# Patient Record
Sex: Male | Born: 1975 | Race: White | Hispanic: No | Marital: Married | Smoking: Former smoker
Health system: Southern US, Community
[De-identification: ages and names within clinical notes are randomized; demographics above are authoritative.]

## PROBLEM LIST (undated history)

## (undated) DIAGNOSIS — Z85831 Personal history of malignant neoplasm of soft tissue: Secondary | ICD-10-CM

## (undated) DIAGNOSIS — N433 Hydrocele, unspecified: Secondary | ICD-10-CM

## (undated) DIAGNOSIS — C801 Malignant (primary) neoplasm, unspecified: Secondary | ICD-10-CM

## (undated) DIAGNOSIS — T783XXA Angioneurotic edema, initial encounter: Secondary | ICD-10-CM

## (undated) HISTORY — DX: Malignant (primary) neoplasm, unspecified: C80.1

## (undated) HISTORY — PX: OTHER SURGICAL HISTORY: SHX169

---

## 1998-08-28 ENCOUNTER — Encounter: Payer: Self-pay | Admitting: Internal Medicine

## 1998-08-28 ENCOUNTER — Ambulatory Visit (HOSPITAL_COMMUNITY): Admission: RE | Admit: 1998-08-28 | Discharge: 1998-08-28 | Payer: Self-pay | Admitting: Internal Medicine

## 2000-06-11 ENCOUNTER — Emergency Department (HOSPITAL_COMMUNITY): Admission: EM | Admit: 2000-06-11 | Discharge: 2000-06-11 | Payer: Self-pay | Admitting: Emergency Medicine

## 2003-10-18 ENCOUNTER — Emergency Department (HOSPITAL_COMMUNITY): Admission: EM | Admit: 2003-10-18 | Discharge: 2003-10-18 | Payer: Self-pay | Admitting: Emergency Medicine

## 2005-06-19 ENCOUNTER — Ambulatory Visit (HOSPITAL_COMMUNITY): Admission: RE | Admit: 2005-06-19 | Discharge: 2005-06-19 | Payer: Self-pay | Admitting: Surgery

## 2005-06-19 ENCOUNTER — Encounter (INDEPENDENT_AMBULATORY_CARE_PROVIDER_SITE_OTHER): Payer: Self-pay | Admitting: *Deleted

## 2005-06-19 ENCOUNTER — Ambulatory Visit (HOSPITAL_BASED_OUTPATIENT_CLINIC_OR_DEPARTMENT_OTHER): Admission: RE | Admit: 2005-06-19 | Discharge: 2005-06-19 | Payer: Self-pay | Admitting: Surgery

## 2005-06-19 HISTORY — PX: OTHER SURGICAL HISTORY: SHX169

## 2005-07-02 ENCOUNTER — Encounter: Admission: RE | Admit: 2005-07-02 | Discharge: 2005-07-02 | Payer: Self-pay | Admitting: Surgery

## 2005-11-04 ENCOUNTER — Encounter: Admission: RE | Admit: 2005-11-04 | Discharge: 2005-11-04 | Payer: Self-pay

## 2005-11-12 ENCOUNTER — Ambulatory Visit: Admission: RE | Admit: 2005-11-12 | Discharge: 2006-01-17 | Payer: Self-pay | Admitting: Radiation Oncology

## 2006-01-22 ENCOUNTER — Ambulatory Visit: Admission: RE | Admit: 2006-01-22 | Discharge: 2006-04-09 | Payer: Self-pay | Admitting: Radiation Oncology

## 2006-07-02 IMAGING — CT CT CHEST W/ CM
1 of 4 series · 14 of 31 positions shown, 18 images · IV contrast (75CC OMNI 300)
Comparison: none

CLINICAL DATA: Sarcoma

[Series 102: routine chest · axial · 0.70mm/px · z∈[-258,+33]mm · 14 of 277 slices shown, 18 images]
[im 22/277  mediastinal]
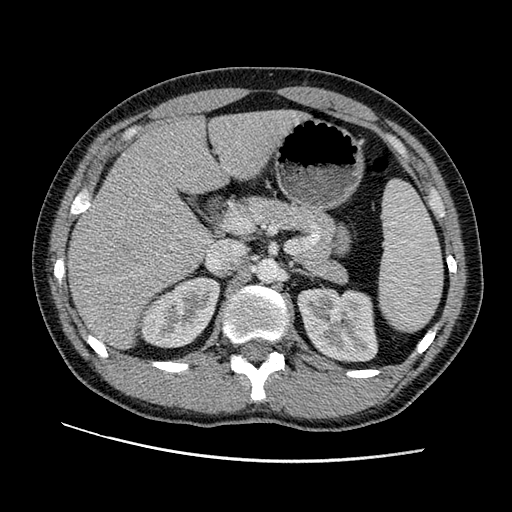
[im 22/277  lung]
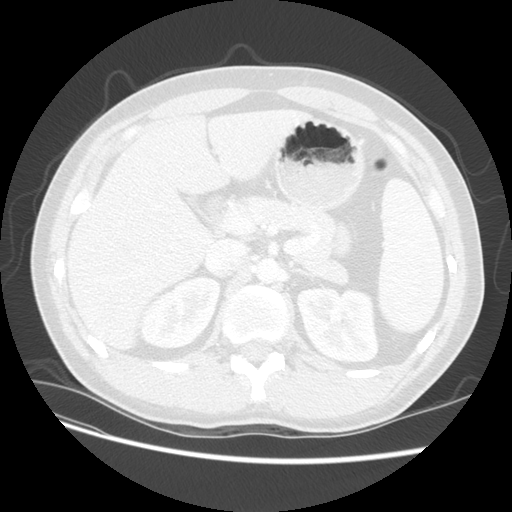
[im 43/277  lung]
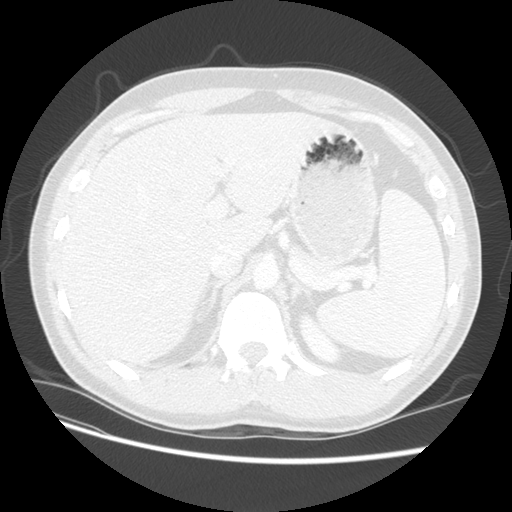
[im 64/277  lung]
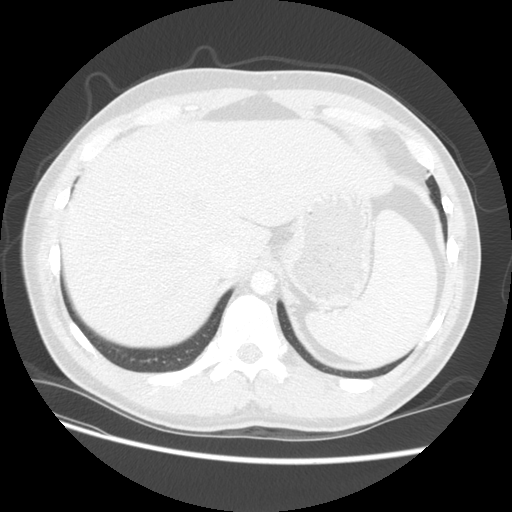
[im 85/277  lung]
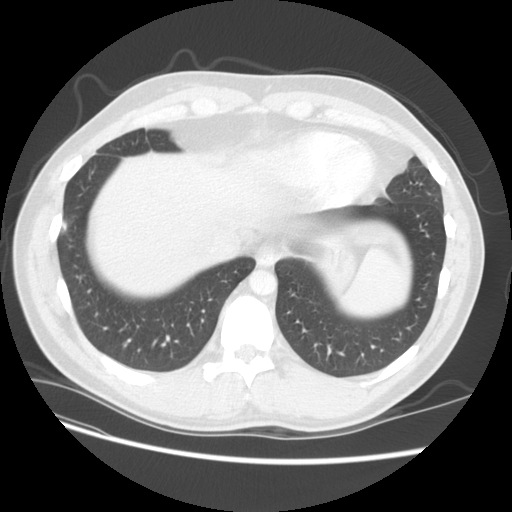
[im 107/277  mediastinal]
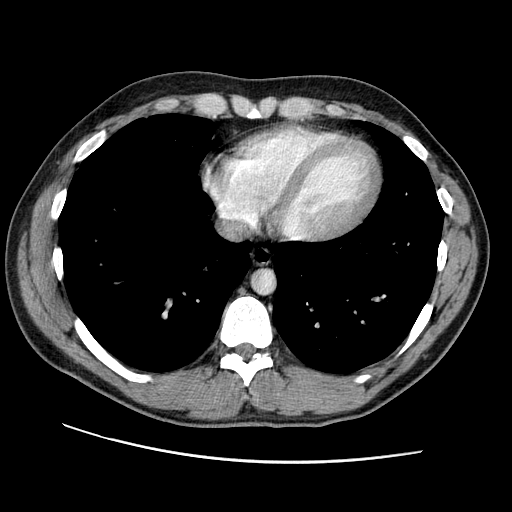
[im 107/277  lung]
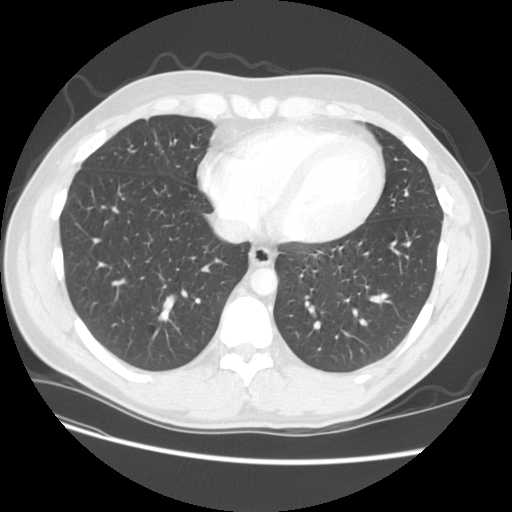
[im 128/277  lung]
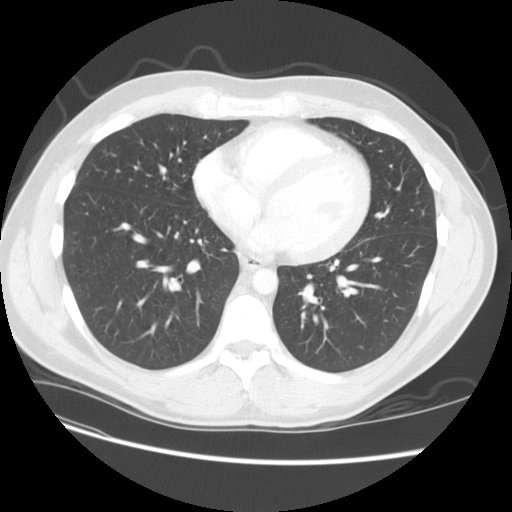
[im 133/277  lung]
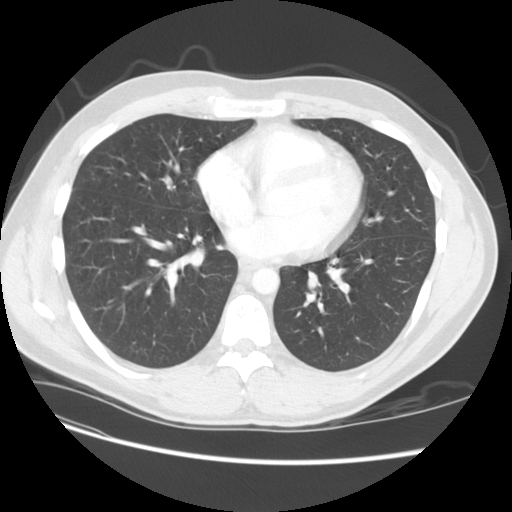
[im 139/277  lung]
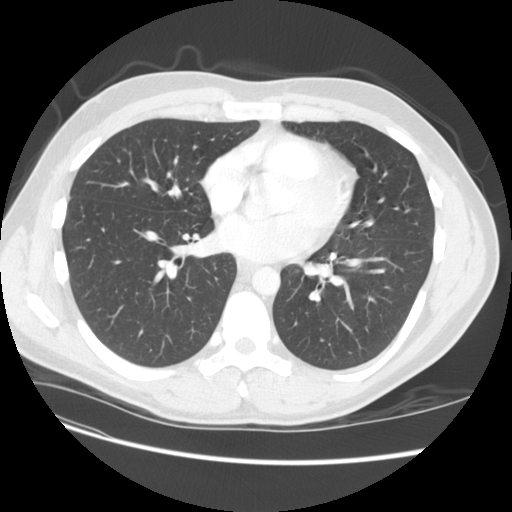
[im 149/277  mediastinal]
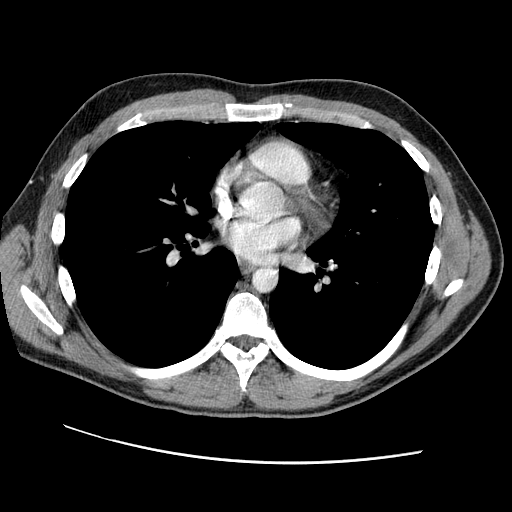
[im 149/277  lung]
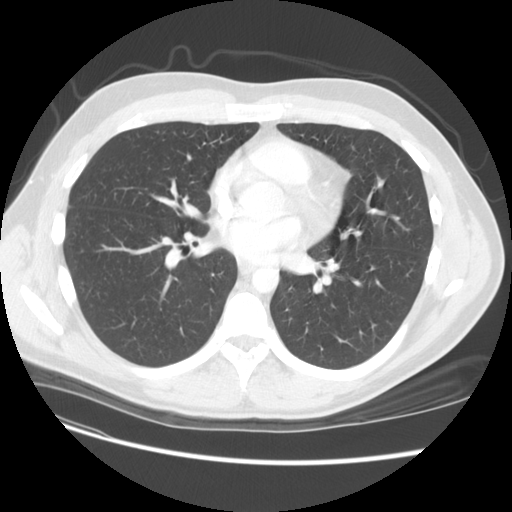
[im 170/277  lung]
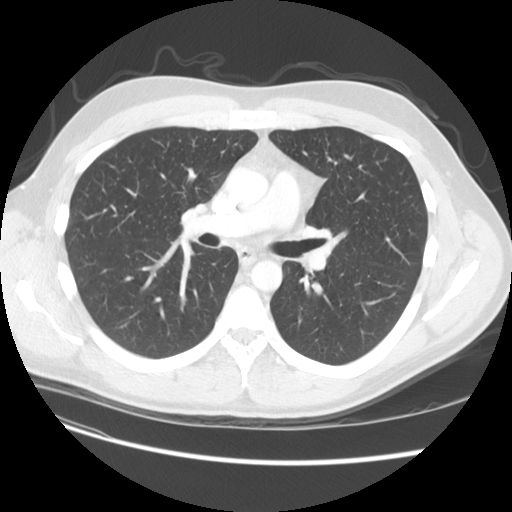
[im 192/277  lung]
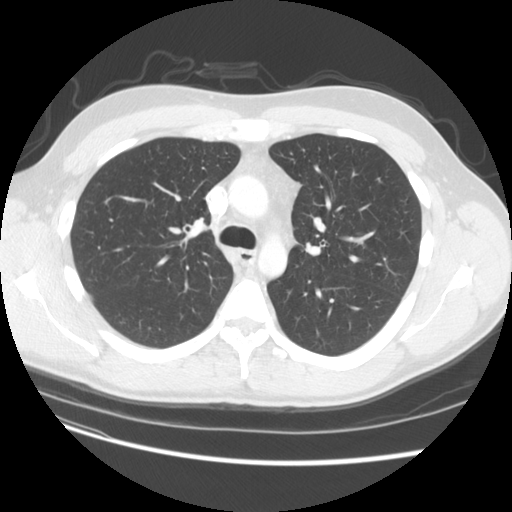
[im 213/277  lung]
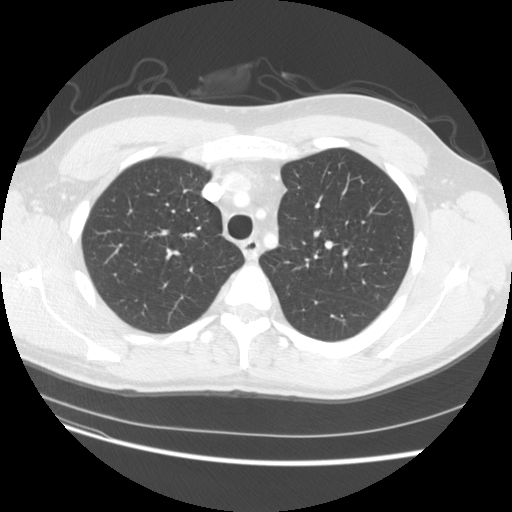
[im 234/277  mediastinal]
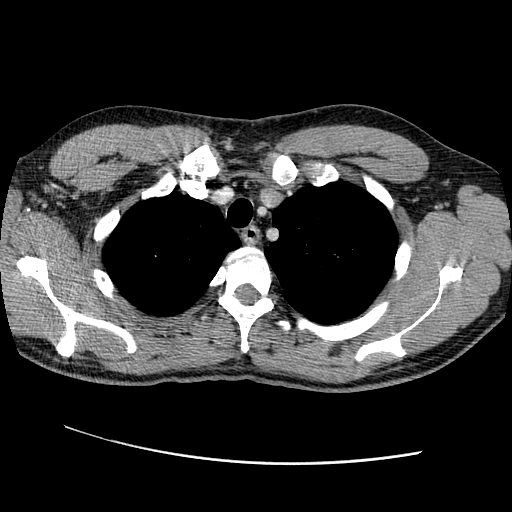
[im 234/277  lung]
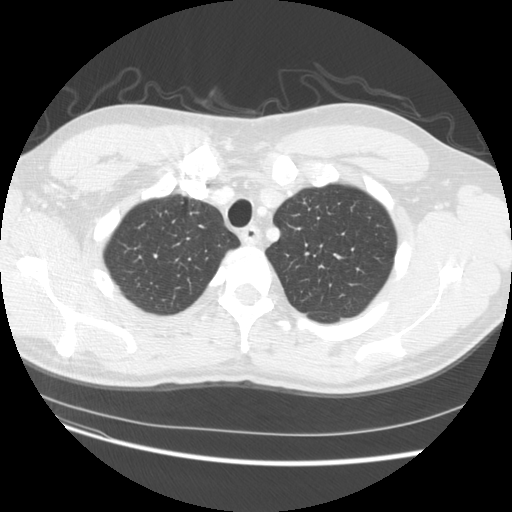
[im 255/277  lung]
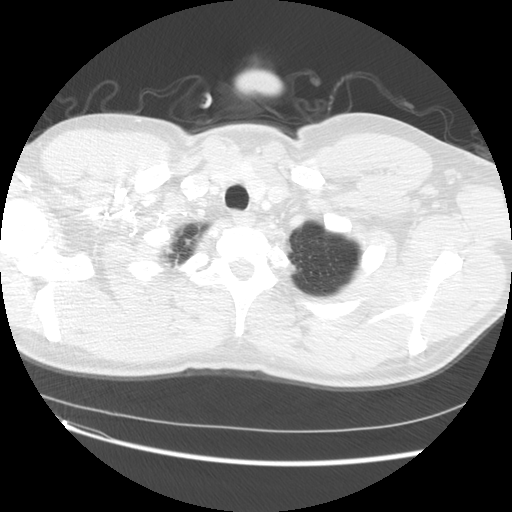

[14 of 31 positions shown; findings below may reference images not displayed]

CT chest with contrast:

Multidetector helical CT after 75 ml Zmnipaque-HOO IV.
No previous for comparison. Prominent residual thymic tissue. Negative for hilar
or mediastinal adenopathy. No pleural or pericardial effusion. Lungs clear. No
nodule. Visualized upper abdomen unremarkable.   Sagittal and coronal
reconstructions confirm the above findings.
IMPRESSION: 1. Negative for nodule, adenopathy, or other evidence of metastatic disease.

## 2007-04-22 ENCOUNTER — Ambulatory Visit: Payer: Self-pay | Admitting: Gastroenterology

## 2007-09-24 DIAGNOSIS — R011 Cardiac murmur, unspecified: Secondary | ICD-10-CM

## 2010-07-15 HISTORY — PX: OTHER SURGICAL HISTORY: SHX169

## 2010-11-27 NOTE — Letter (Signed)
April 22, 2007    Mr. Develle Sievers   RE:  CAIO, DEVERA  MRN:  [id number]  /  DOB:  [birth year]   Dear Mr. Tilden Fossa:   It is my pleasure to have treated you recently as a new patient in my  office.  I appreciate your confidence and the opportunity to participate  in your care.   Since I do have a busy inpatient endoscopy schedule and office schedule,  my office hours vary weekly.  I am, however, available for emergency  calls every day through my office.  If I cannot promptly meet an urgent  office appointment, another one of our gastroenterologists will be able  to assist you.   My well-trained staff are prepared to help you at all times.  For  emergencies after office hours, a physician from our gastroenterology  section is always available through my 24-hour answering service.   While you are under my care, I encourage discussion of your questions  and concerns, and I will be happy to return your calls as soon as I am  available.   Once again, I welcome you as a new patient and I look forward to a happy  and healthy relationship.    Sincerely,      Barbette Hair. Arlyce Dice, MD,FACG  Electronically Signed   RDK/MedQ  DD: 04/22/2007  DT: 04/22/2007  Job #: [id number]

## 2010-11-27 NOTE — Assessment & Plan Note (Signed)
[facility]                         GASTROENTEROLOGY OFFICE NOTE   NAME:[name], [name]                      MRN:          [id number]  DATE:04/22/2007                            DOB:          [birth year]    PROBLEM:  Abdominal pain.   HISTORY OF PRESENT ILLNESS:  Mr. [name] Shelton is a pleasant 35 year old white  male self-referred for evaluation of burning epigastric pain.  He has  had pain for several weeks.  It is described as a mild burning pain  accompanied by pyrosis.  It is lessened postprandially.  He is on no  gastric irritants including nonsteroidal's.  He claims to have seen some  black streaks coating his stools.  Rarely has he seen small amounts of  blood on the toilet tissue.  He has no prior GI complaints.   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   FAMILY HISTORY:  Noncontributory.   MEDICATIONS:  He is on no medications.   ALLERGIES:  He has no allergies.   SOCIAL HISTORY:  He does not smoke.  He drinks about one beer or two a  day.  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   REVIEW OF SYSTEMS:  Was reviewed and is negative.   PHYSICAL EXAMINATION:  VITAL SIGNS:  Pulse  60.  Blood pressure 118/64.  Weight 195.  HEENT:  EOMI.  PERRLA.  Sclerae are anicteric.  Conjunctivae are pink.  NECK:  Supple without thyromegaly, adenopathy or carotid bruits.  CHEST:  Clear to auscultation and percussion without adventitious  sounds.  CARDIAC:  Regular rhythm; normal S1 S2.  There are no murmurs, gallops  or rubs.  ABDOMEN:  Bowel sounds are normoactive.  Abdomen is soft, nontender and  nondistended.  There are no abdominal masses, tenderness, splenic  enlargement or hepatomegaly.  EXTREMITIES:  Full range of motion.  No cyanosis, clubbing or edema.  RECTAL:  Stool is Hemoccult negative.   IMPRESSION:  1. Nonspecific epigastric burning.   This could be due to non-ulcer      dyspepsia, possibly due to gastroesophageal reflux disease as well.  2. History of leiomyosarcoma.  3. Questionable history of gastrointestinal bleeding.   RECOMMENDATION:  1. Empiric trial of Protonix 40 mg a day.  2. Serial Hemoccult's.  3. If Mr. [name] Shelton responds to Protonix and he is Hemoccult negative, I      will not pursue his gastrointestinal work up any further.     Barbette Hair. Arlyce Dice, MD,FACG  Electronically Signed    RDK/MedQ  DD: 04/22/2007  DT: 04/22/2007  Job #: [id number]

## 2010-11-30 NOTE — Op Note (Signed)
NAMEVANCE, HOCHMUTH             ACCOUNT NO.:  [id number]   MEDICAL RECORD NO.:  [id number]          PATIENT TYPE:  AMB   LOCATION:  DSC                          FACILITY:  [facility]   PHYSICIAN:  Thornton Park. Daphine Deutscher, MD  DATE OF BIRTH:  1975/10/02   DATE OF PROCEDURE:  06/19/2005  DATE OF DISCHARGE:                                 OPERATIVE REPORT   PROCEDURE:  Right inguinal lymph node biopsy.   SURGEON:  Thornton Park. Daphine Deutscher, M.D.   ANESTHESIA:  General by LMA.   DESCRIPTION OF PROCEDURE:  Jony Ladnier was taken to room 6 at [facility] on June 19, 2005 and given general by LMA. Preoperatively, he  received Ancef and gentamicin. A small oblique incision was made down in his  inguinal crease and dissected down to a very firm, hard node was  encapsulated. I got into the capsule and I exposed this.  It was fairly  fixed to the underlying structures, and I wanted to minimize his  complications from lymphocele, etc, so I went ahead and excised the greatest  portion of this more superficial node. It was hard and felt worrisome.  I  sent it for permanent and touch preps. The remnant was cauterized with  electrocautery. It was then closed with 4-0 Vicryl in multiple layers with  Benzoin and Steri-Strips on the skin. The patient will be given Percocet to  take for pain and will be followed up in the office in a couple weeks.      Thornton Park Daphine Deutscher, MD  Electronically Signed     MBM/MEDQ  D:  06/19/2005  T:  06/19/2005  Job:  [id number]

## 2011-06-18 DIAGNOSIS — C499 Malignant neoplasm of connective and soft tissue, unspecified: Secondary | ICD-10-CM | POA: Insufficient documentation

## 2011-06-18 DIAGNOSIS — C787 Secondary malignant neoplasm of liver and intrahepatic bile duct: Secondary | ICD-10-CM | POA: Insufficient documentation

## 2011-10-14 ENCOUNTER — Telehealth: Payer: Self-pay | Admitting: Gastroenterology

## 2011-10-14 NOTE — Telephone Encounter (Signed)
Spoke with patients wife and scheduled him with Dr. Arlyce Dice on 10/16/11 at 11:00 AM.

## 2011-10-14 NOTE — Telephone Encounter (Signed)
Left a message to call back.

## 2011-10-16 ENCOUNTER — Ambulatory Visit (INDEPENDENT_AMBULATORY_CARE_PROVIDER_SITE_OTHER): Payer: Managed Care, Other (non HMO) | Admitting: Gastroenterology

## 2011-10-16 ENCOUNTER — Encounter: Payer: Self-pay | Admitting: Gastroenterology

## 2011-10-16 VITALS — BP 128/72 | HR 60 | Ht 73.0 in | Wt 194.5 lb

## 2011-10-16 DIAGNOSIS — K648 Other hemorrhoids: Secondary | ICD-10-CM | POA: Insufficient documentation

## 2011-10-16 MED ORDER — HYDROCORTISONE ACETATE 25 MG RE SUPP: 25.0000 mg | Freq: Two times a day (BID) | RECTAL | Status: DC

## 2011-10-16 NOTE — Patient Instructions (Signed)
Follow up with Dr Kaplan as needed. 

## 2011-10-16 NOTE — Progress Notes (Signed)
History of Present Illness: [name] Shelton is a pleasant 36 year old white male self-referred for evaluation of hemorrhoids. He's been complaining of rectal discomfort with some discharge of mucus and small amount of blood. He took over-the-counter suppositories with improvement. He moves his bowels regularly. There's been no change in his bowel habits. He denies abdominal pain.    Past Medical History  Diagnosis Date  . Liver cancer   . Sarcoma     right leg   Past Surgical History  Procedure Date  . Liver surgery 2012  . Leg surgery 2006    sarcoma   family history is not on file.  He is adopted. Current Outpatient Prescriptions  Medication Sig Dispense Refill  . Multiple Vitamins-Minerals (ONE-A-DAY VITACRAVES) CHEW Chew 1 tablet by mouth daily.       Allergies as of 10/16/2011  . (No Known Allergies)    reports that he quit smoking about 7 years ago. His smoking use included Cigarettes. He has never used smokeless tobacco. He reports that he drinks alcohol. He reports that he does not use illicit drugs.     Review of Systems: Pertinent positive and negative review of systems were noted in the above HPI section. All other review of systems were otherwise negative.  Vital signs were reviewed in today's medical record Physical Exam: General: Well developed , well nourished, no acute distress Head: Normocephalic and atraumatic Eyes:  sclerae anicteric, EOMI Ears: Normal auditory acuity Mouth: No deformity or lesions Neck: Supple, no masses or thyromegaly Lungs: Clear throughout to auscultation Heart: Regular rate and rhythm; no murmurs, rubs or bruits Abdomen: Soft, non tender and non distended. No masses, hepatosplenomegaly or hernias noted. Normal Bowel sounds Rectal: No external lesions Musculoskeletal: Symmetrical with no gross deformities  Skin: No lesions on visible extremities Pulses:  Normal pulses noted Extremities: No clubbing, cyanosis, edema or deformities  noted Neurological: Alert oriented x 4, grossly nonfocal Cervical Nodes:  No significant cervical adenopathy Inguinal Nodes: No significant inguinal adenopathy Psychological:  Alert and cooperative. Normal mood and affect

## 2011-10-16 NOTE — Assessment & Plan Note (Signed)
Symptoms are compatible with internal hemorrhoids. He's had a moderately good response to over-the-counter suppositories.  Recommendations #1 Anusol HC suppositories #2 to consider band ligation if symptoms recur or become refractory to medical therapy

## 2011-11-12 ENCOUNTER — Other Ambulatory Visit: Payer: Self-pay | Admitting: Gastroenterology

## 2011-11-12 DIAGNOSIS — K648 Other hemorrhoids: Secondary | ICD-10-CM

## 2011-11-12 MED ORDER — HYDROCORTISONE ACETATE 25 MG RE SUPP: 25.0000 mg | Freq: Two times a day (BID) | RECTAL | Status: DC

## 2011-11-12 NOTE — Telephone Encounter (Signed)
Medication sent to pharmacy  

## 2011-12-03 ENCOUNTER — Other Ambulatory Visit: Payer: Self-pay | Admitting: Gastroenterology

## 2013-07-22 ENCOUNTER — Ambulatory Visit (INDEPENDENT_AMBULATORY_CARE_PROVIDER_SITE_OTHER): Payer: Managed Care, Other (non HMO) | Admitting: Physician Assistant

## 2013-07-22 VITALS — BP 134/76 | HR 70 | Temp 98.3°F | Resp 16 | Ht 73.5 in | Wt 192.2 lb

## 2013-07-22 DIAGNOSIS — K649 Unspecified hemorrhoids: Secondary | ICD-10-CM

## 2013-07-22 MED ORDER — HYDROCORTISONE ACETATE 25 MG RE SUPP: 25.0000 mg | Freq: Two times a day (BID) | RECTAL | Status: DC

## 2013-07-22 NOTE — Patient Instructions (Signed)
Begin using Anusol suppositories twice daily.  Use Metamucil or Miralax (both available over the counter) to keep stools soft.  We want to avoid any straining.  Drink plenty of water and make sure you are eating high fiber foods (fruit, vegetables, whole grains) to also help keep stool soft.  If this area is worsening or not improving in the next week or so, we may need to open it   Hemorrhoids Hemorrhoids are swollen veins around the rectum or anus. There are two types of hemorrhoids:   Internal hemorrhoids. These occur in the veins just inside the rectum. They may poke through to the outside and become irritated and painful.  External hemorrhoids. These occur in the veins outside the anus and can be felt as a painful swelling or hard lump near the anus. CAUSES  Pregnancy.   Obesity.   Constipation or diarrhea.   Straining to have a bowel movement.   Sitting for long periods on the toilet.  Heavy lifting or other activity that caused you to strain.  Anal intercourse. SYMPTOMS   Pain.   Anal itching or irritation.   Rectal bleeding.   Fecal leakage.   Anal swelling.   One or more lumps around the anus.  DIAGNOSIS  Your caregiver may be able to diagnose hemorrhoids by visual examination. Other examinations or tests that may be performed include:   Examination of the rectal area with a gloved hand (digital rectal exam).   Examination of anal canal using a small tube (scope).   A blood test if you have lost a significant amount of blood.  A test to look inside the colon (sigmoidoscopy or colonoscopy). TREATMENT Most hemorrhoids can be treated at home. However, if symptoms do not seem to be getting better or if you have a lot of rectal bleeding, your caregiver may perform a procedure to help make the hemorrhoids get smaller or remove them completely. Possible treatments include:   Placing a rubber band at the base of the hemorrhoid to cut off the circulation  (rubber band ligation).   Injecting a chemical to shrink the hemorrhoid (sclerotherapy).   Using a tool to burn the hemorrhoid (infrared light therapy).   Surgically removing the hemorrhoid (hemorrhoidectomy).   Stapling the hemorrhoid to block blood flow to the tissue (hemorrhoid stapling).  HOME CARE INSTRUCTIONS   Eat foods with fiber, such as whole grains, beans, nuts, fruits, and vegetables. Ask your doctor about taking products with added fiber in them (fibersupplements).  Increase fluid intake. Drink enough water and fluids to keep your urine clear or pale yellow.   Exercise regularly.   Go to the bathroom when you have the urge to have a bowel movement. Do not wait.   Avoid straining to have bowel movements.   Keep the anal area dry and clean. Use wet toilet paper or moist towelettes after a bowel movement.   Medicated creams and suppositories may be used or applied as directed.   Only take over-the-counter or prescription medicines as directed by your caregiver.   Take warm sitz baths for 15 20 minutes, 3 4 times a day to ease pain and discomfort.   Place ice packs on the hemorrhoids if they are tender and swollen. Using ice packs between sitz baths may be helpful.   Put ice in a plastic bag.   Place a towel between your skin and the bag.   Leave the ice on for 15 20 minutes, 3 4 times a day.  Do not use a donut-shaped pillow or sit on the toilet for long periods. This increases blood pooling and pain.  SEEK MEDICAL CARE IF:  You have increasing pain and swelling that is not controlled by treatment or medicine.  You have uncontrolled bleeding.  You have difficulty or you are unable to have a bowel movement.  You have pain or inflammation outside the area of the hemorrhoids. MAKE SURE YOU:  Understand these instructions.  Will watch your condition.  Will get help right away if you are not doing well or get worse. Document Released:  06/28/2000 Document Revised: 06/17/2012 Document Reviewed: 05/05/2012 [facility] Patient Information 2014 [place].

## 2013-07-22 NOTE — Progress Notes (Signed)
   Subjective:    Patient ID: [name] Shelton, male    DOB: September 03, 1975, 38 y.o.   MRN: [id number]  HPI   [name] Shelton is a very pleasant 38 yr old male here with concern for a thrombosed hemorrhoid.  Has had hemorrhoids in the past, one thrombosed about 2-3 yrs ago.  Noted this hemorrhoid 2 days ago after coughing.  Thinks it may be thrombosed, though it does not feel as bad as his previous episode of this.  Has tried preparation H with without relief.  He denies constipation or recent straining.  Denies BRBPR.  Denies fever, chills, abdominal pain.  Otherwise feels well today.     Review of Systems  Constitutional: Negative for fever and chills.  Respiratory: Negative.   Cardiovascular: Negative.   Gastrointestinal: Positive for rectal pain (hemorrhoid). Negative for abdominal pain, diarrhea and constipation.  Musculoskeletal: Negative.   Skin: Negative.        Objective:   Physical Exam  Vitals reviewed. Constitutional: He is oriented to person, place, and time. He appears well-developed and well-nourished. No distress.  HENT:  Head: Normocephalic and atraumatic.  Eyes: Conjunctivae are normal. No scleral icterus.  Pulmonary/Chest: Effort normal.  Genitourinary: Rectal exam shows external hemorrhoid.     Small external hemorrhoid, mildly swollen but not erythematous or firm; mildly tender to palpation  Neurological: He is alert and oriented to person, place, and time.  Skin: Skin is warm and dry.  Psychiatric: He has a normal mood and affect. His behavior is normal.        Assessment & Plan:  Hemorrhoids - Plan: hydrocortisone (ANUSOL-HC) 25 MG suppository   [name] Shelton is a pleasant 38 yr old male here with a small external hemorrhoid.  On exam it is mildly tender, but I am not convinced that it is thrombosed at this point.  Will try conservative treatment with sitz baths, anusol.  Encouraged him to use Metamucil or Miralax to keep stools soft and avoid straining.  Pt to  call if worsening or not improving.  I think it would be reasonable to fast track him for recheck if he is worsening in the next few days   E. Natividad Brood MHS, PA-C [facility] 1/8/20151:26 PM

## 2013-08-26 ENCOUNTER — Other Ambulatory Visit: Payer: Self-pay | Admitting: Physician Assistant

## 2014-07-09 ENCOUNTER — Ambulatory Visit (INDEPENDENT_AMBULATORY_CARE_PROVIDER_SITE_OTHER): Payer: Managed Care, Other (non HMO) | Admitting: Emergency Medicine

## 2014-07-09 VITALS — BP 132/80 | HR 74 | Temp 98.4°F | Resp 16 | Ht 74.5 in | Wt 196.6 lb

## 2014-07-09 DIAGNOSIS — J029 Acute pharyngitis, unspecified: Secondary | ICD-10-CM

## 2014-07-09 LAB — POCT RAPID STREP A (OFFICE): Rapid Strep A Screen: NEGATIVE

## 2014-07-09 MED ORDER — HYDROCODONE-HOMATROPINE 5-1.5 MG/5ML PO SYRP: ORAL_SOLUTION | ORAL | Status: DC

## 2014-07-09 MED ORDER — PREDNISONE 20 MG PO TABS: ORAL_TABLET | ORAL | Status: DC

## 2014-07-09 NOTE — Progress Notes (Signed)
   Subjective:    Patient ID: [name] Shelton, male    DOB: Jun 25, 1976, 38 y.o.   MRN: [id number]  HPI 38 year old male here for cough and sore throat. Pt started feeling ill ten days ago; woke up with a fever of 101. No cough. No fever for past 5-6 days. Has been taking Mucinex DM and Nyquil. Has been using throat lozenges for sore throat. Pt had a little bit of a cough last night. Former smoker; quit nine years ago. Pt did have otalgia, but states it has cleared up. Pt is very tender to touch on his neck.      Review of Systems  Constitutional: Positive for fever and fatigue. Negative for chills, activity change and appetite change.  HENT: Positive for congestion, sore throat, trouble swallowing and voice change. Negative for ear pain.   Respiratory: Positive for cough. Negative for chest tightness, shortness of breath and wheezing.   Cardiovascular: Negative for chest pain.       Objective:   Physical Exam  Constitutional: He appears well-developed and well-nourished.  Cardiovascular: Normal rate and regular rhythm.   Pulmonary/Chest: Effort normal and breath sounds normal.   Results for orders placed or performed in visit on 07/09/14  POCT rapid strep A  Result Value Ref Range   Rapid Strep A Screen Negative Negative          Assessment & Plan:  We'll treat with a 5 days  of prednisone, voice rest. and Hycodan at night for cough.I personally performed the services described in this documentation, which was scribed in my presence. The recorded information has been reviewed and is accurate.

## 2014-07-09 NOTE — Patient Instructions (Signed)
Laryngitis °At the top of your windpipe is your voice box. It is the source of your voice. Inside your voice box are 2 bands of muscles called vocal cords. When you breathe, your vocal cords are relaxed and open so that air can get into the lungs. When you decide to say something, these cords come together and vibrate. The sound from these vibrations goes into your throat and comes out through your mouth as sound.  °Laryngitis is an inflammation of the vocal cords that causes hoarseness, cough, loss of voice, sore throat, and dry throat. Laryngitis can be temporary (acute) or long-term (chronic). Most cases of acute laryngitis improve with time.Chronic laryngitis lasts for more than 3 weeks. °CAUSES °Laryngitis can often be related to excessive smoking, talking, or yelling, as well as inhalation of toxic fumes and allergies. Acute laryngitis is usually caused by a viral infection, vocal strain, measles or mumps, or bacterial infections. Chronic laryngitis is usually caused by vocal cord strain, vocal cord injury, postnasal drip, growths on the vocal cords, or acid reflux. °SYMPTOMS  °· Cough. °· Sore throat. °· Dry throat. °RISK FACTORS °· Respiratory infections. °· Exposure to irritating substances, such as cigarette smoke, excessive amounts of alcohol, stomach acids, and workplace chemicals. °· Voice trauma, such as vocal cord injury from shouting or speaking too loud. °DIAGNOSIS  °Your cargiver will perform a physical exam. During the physical exam, your caregiver will examine your throat. The most common sign of laryngitis is hoarseness. Laryngoscopy may be necessary to confirm the diagnosis of this condition. This procedure allows your caregiver to look into the larynx. °HOME CARE INSTRUCTIONS °· Drink enough fluids to keep your urine clear or pale yellow. °· Rest until you no longer have symptoms or as directed by your caregiver. °· Breathe in moist air. °· Take all medicine as directed by your  caregiver. °· Do not smoke. °· Talk as little as possible (this includes whispering). °· Write on paper instead of talking until your voice is back to normal. °· Follow up with your caregiver if your condition has not improved after 10 days. °SEEK MEDICAL CARE IF:  °· You have trouble breathing. °· You cough up blood. °· You have persistent fever. °· You have increasing pain. °· You have difficulty swallowing. °MAKE SURE YOU: °· Understand these instructions. °· Will watch your condition. °· Will get help right away if you are not doing well or get worse. °Document Released: 07/01/2005 Document Revised: 09/23/2011 Document Reviewed: 09/06/2010 °[employer]® Patient Information ©2015 [employer]. This information is not intended to replace advice given to you by your health care provider. Make sure you discuss any questions you have with your health care provider. ° °

## 2014-07-11 LAB — CULTURE, GROUP A STREP: ORGANISM ID, BACTERIA: NORMAL

## 2015-01-22 ENCOUNTER — Ambulatory Visit (INDEPENDENT_AMBULATORY_CARE_PROVIDER_SITE_OTHER): Payer: Managed Care, Other (non HMO) | Admitting: Emergency Medicine

## 2015-01-22 VITALS — BP 152/84 | HR 69 | Temp 98.2°F | Resp 14 | Ht 73.5 in | Wt 190.2 lb

## 2015-01-22 DIAGNOSIS — N433 Hydrocele, unspecified: Secondary | ICD-10-CM

## 2015-01-22 DIAGNOSIS — N508 Other specified disorders of male genital organs: Secondary | ICD-10-CM | POA: Diagnosis not present

## 2015-01-22 DIAGNOSIS — N5089 Other specified disorders of the male genital organs: Secondary | ICD-10-CM

## 2015-01-22 NOTE — Patient Instructions (Signed)
Hydrocele, Adult Fluid can collect around the testicles. This fluid forms in a sac. This condition is called a hydrocele. The collected fluid causes swelling of the scrotum. Usually, it affects just one testicle. Most of the time, the condition does not cause pain. Sometimes, the hydrocele goes away on its own. Other times, surgery is needed to get rid of the fluid. CAUSES A hydrocele does not develop often. Different things can cause a hydrocele in a man, including:  Injury to the scrotum.  Infection.  X-ray of the area around the scrotum.  A tumor or cancer of the testicle.  Twisting of a testicle.  Decreased blood flow to the scrotum. SYMPTOMS   Swelling without pain. The hydrocele feels like a water-filled balloon.  Swelling with pain. This can occur if the hydrocele was caused by infection or twisting.  Mild discomfort in the scrotum.  The hydrocele may feel heavy.  Swelling that gets smaller when you lie down. DIAGNOSIS  Your caregiver will do a physical exam to decide if you have a hydrocele. This may include:  Asking questions about your overall health, today and in the past. Your caregiver may ask about any injuries, X-rays, or infections.  Pushing on your abdomen or asking you to change positions to see if the size of the hydrocele changes.  Shining a light through the scrotum (transillumination) to see if the fluid inside the scrotum is clear.  Blood tests and urine tests to check for infection.  Imaging studies that take pictures of the scrotum and testicles. TREATMENT  Treatment depends in part on what caused the condition. Options include:  Watchful waiting. Your caregiver checks the hydrocele every so often.  Different surgeries to drain the fluid.  A needle may be put into the scrotum to drain fluid (needle aspiration). Fluid often returns after this type of treatment.  A cut (incision) may be made in the scrotum to remove the fluid sac  (hydrocelectomy).  An incision may be made in the groin to repair a hydrocele that has contact with abdominal fluids (communicating hydrocele).  Medicines to treat an infection (antibiotics). HOME CARE INSTRUCTIONS  What you need to do at home may depend on the cause of the hydrocele and type of treatment. In general:  Take all medicine as directed by your caregiver. Follow the directions carefully.  Ask your caregiver if there is anything you should not do while you recover (activities, lifting, work, sex).  If you had surgery to repair a communicating hydrocele, recovery time may vary. Ask you caregiver about your recovery time.  Avoid heavy lifting for 4 to 6 weeks.  If you had an incision on the scrotum or groin, wash it for 2 to 3 days after surgery. Do this as long as the skin is closed and there are no gaps in the wound. Wash gently, and avoid rubbing the incision.  Keep all follow-up appointments. SEEK MEDICAL CARE IF:   Your scrotum seems to be getting larger.  The area becomes more and more uncomfortable. SEEK IMMEDIATE MEDICAL CARE IF:  You have a fever. Document Released: 12/19/2009 Document Revised: 04/21/2013 Document Reviewed: 12/19/2009 [facility] Patient Information 2015 [place], [place]. This information is not intended to replace advice given to you by your health care provider. Make sure you discuss any questions you have with your health care provider.

## 2015-01-22 NOTE — Progress Notes (Signed)
Subjective:  Patient ID: [name] Shelton, male    DOB: 1976/03/31  Age: 39 y.o. MRN: [id number]  CC: Groin Pain and Testicle Pain   HPI [name] Shelton presents  with a several week history of swelling and discomfort in his right testicle. He has no history of injury. No dysuria urgency or frequency. No urethral discharge. 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 His scrotum is not really tender. He has no testicular tenderness and masses right side only.  History [name] Shelton has a past medical history of Liver cancer and Sarcoma.   He has past surgical history that includes Liver surgery (2012) and Leg Surgery (2006).   His  family history is not on file. He was adopted.  He   reports that he quit smoking about 10 years ago. His smoking use included Cigarettes. He has never used smokeless tobacco. He reports that he drinks alcohol. He reports that he does not use illicit drugs.  Outpatient Prescriptions Prior to Visit  Medication Sig Dispense Refill  . HYDROcodone-homatropine (HYCODAN) 5-1.5 MG/5ML syrup Take 1  tsp at night for cough (Patient not taking: Reported on 01/22/2015) 120 mL 0  . hydrocortisone (ANUSOL-HC) 25 MG suppository PLACE 1 SUPPOSITORY (25 MG TOTAL) RECTALLY 2 (TWO) TIMES DAILY. (Patient not taking: Reported on 07/09/2014) 12 suppository 4  . Multiple Vitamins-Minerals (ONE-A-DAY VITACRAVES) CHEW Chew 1 tablet by mouth daily.    . predniSONE (DELTASONE) 20 MG tablet Take 2 tablets daily for 5 consecutive days (Patient not taking: Reported on 01/22/2015) 10 tablet 0   No facility-administered medications prior to visit.    History   Social History  . Marital Status: Married    Spouse Name: N/A  . Number of Children: 0  . Years of Education: N/A   Occupational History  . Writer    Social History Main Topics  . Smoking status: Former Smoker    Types: Cigarettes    Quit date: 07/15/2004  . Smokeless tobacco: Never Used  . Alcohol  Use: Yes     Comment: 2 per day  . Drug Use: No  . Sexual Activity: Not on file   Other Topics Concern  . None   Social History Narrative     Review of Systems  Constitutional: Negative for fever, chills and appetite change.  HENT: Negative for congestion, ear pain, postnasal drip, sinus pressure and sore throat.   Eyes: Negative for pain and redness.  Respiratory: Negative for cough, shortness of breath and wheezing.   Cardiovascular: Negative for leg swelling.  Gastrointestinal: Negative for nausea, vomiting, abdominal pain, diarrhea, constipation and blood in stool.  Endocrine: Negative for polyuria.  Genitourinary: Positive for scrotal swelling and testicular pain. Negative for dysuria, urgency, frequency and flank pain.  Musculoskeletal: Negative for gait problem.  Skin: Negative for rash.  Neurological: Negative for weakness and headaches.  Psychiatric/Behavioral: Negative for confusion and decreased concentration. The patient is not nervous/anxious.     Objective:  BP 152/84 mmHg  Pulse 69  Temp(Src) 98.2 F (36.8 C) (Oral)  Resp 14  Ht 6' 1.5" (1.867 m)  Wt 190 lb 3.2 oz (86.274 kg)  BMI 24.75 kg/m2  SpO2 98%  Physical Exam  Constitutional: He is oriented to person, place, and time. He appears well-developed and well-nourished.  HENT:  Head: Normocephalic and atraumatic.  Eyes: Conjunctivae are normal. Pupils are equal, round, and reactive to light.  Pulmonary/Chest: Effort normal.  Abdominal: Hernia confirmed negative in the right inguinal  area and confirmed negative in the left inguinal area.  Genitourinary: Right testis shows mass and swelling. Left testis shows no mass, no swelling and no tenderness. No discharge found.  Musculoskeletal: He exhibits no edema.  Neurological: He is alert and oriented to person, place, and time.  Skin: Skin is dry.  Psychiatric: He has a normal mood and affect. His behavior is normal. Thought content normal.       Assessment & Plan:   Darreld was seen today for groin pain and testicle pain.  Diagnoses and all orders for this visit:  Testicular mass Orders: -     US Scrotum; Future  Hydrocele in adult Orders: -     US Scrotum; Future   I am having Mr. Reader maintain his ONE-A-DAY VITACRAVES, hydrocortisone, predniSONE, and HYDROcodone-homatropine.  No orders of the defined types were placed in this encounter.   there is no evident hernia. The cord is normal bilaterally. He has a firm mass in his right hemiscrotum. This mass and includes the testicle can't feel the epididymis likely result is a hydrocele. Will talk with him after he gets his ultrasound on   Appropriate red flag conditions were discussed with the patient as well as actions that should be taken.  Patient expressed his understanding.  Follow-up: Return if symptoms worsen or fail to improve.  Roselee Culver, MD

## 2015-01-24 ENCOUNTER — Ambulatory Visit
Admission: RE | Admit: 2015-01-24 | Discharge: 2015-01-24 | Disposition: A | Discharge status: Home / Self Care | Payer: Managed Care, Other (non HMO) | Source: Ambulatory Visit | Attending: Emergency Medicine | Admitting: Emergency Medicine

## 2015-01-24 DIAGNOSIS — N433 Hydrocele, unspecified: Secondary | ICD-10-CM

## 2015-01-24 DIAGNOSIS — N5089 Other specified disorders of the male genital organs: Secondary | ICD-10-CM

## 2015-02-21 ENCOUNTER — Ambulatory Visit (INDEPENDENT_AMBULATORY_CARE_PROVIDER_SITE_OTHER): Payer: Managed Care, Other (non HMO) | Admitting: Family Medicine

## 2015-02-21 VITALS — BP 140/80 | HR 60 | Temp 98.2°F | Resp 16 | Ht 74.0 in | Wt 199.0 lb

## 2015-02-21 DIAGNOSIS — L03211 Cellulitis of face: Secondary | ICD-10-CM | POA: Diagnosis not present

## 2015-02-21 DIAGNOSIS — L0201 Cutaneous abscess of face: Secondary | ICD-10-CM

## 2015-02-21 MED ORDER — SULFAMETHOXAZOLE-TRIMETHOPRIM 800-160 MG PO TABS: 1.0000 | ORAL_TABLET | Freq: Two times a day (BID) | ORAL | Status: DC

## 2015-02-21 NOTE — Patient Instructions (Signed)
Take the Bactrim DS one twice daily (sulfamethoxazole) for infection  Apply warm compresses to the face several times today and tomorrow  If any pus drains you can squeeze it out.  Return if worse at any time, otherwise no follow-up is needed.

## 2015-02-21 NOTE — Progress Notes (Signed)
  Subjective:  Patient ID: [name] Shelton, male    DOB: 1975-10-15  Age: 39 y.o. MRN: [id number]  39 year old man with a history of several days of having a little red spot come up on the right cheek bone. This has gotten a lot worse the last 2 days. He tried to squeeze it yesterday but it's very painful. Today's had more swelling in the cheek.   Objective:   Abscess right cheek bone. This is red and tender. It's firm feeling with no fluctuance.  Procedure note The area was numbed with 1% lidocaine with epinephrine. Using an 11 blade and the small incision was made. Some thick white cheesy pus was expressed. The patient tolerated this well. Culture was taken.  Assessment & Plan:   Assessment:  Abscess and cellulitis of face  Plan:  Anabiotic's, follow-up as needed  Patient Instructions  Take the Bactrim DS one twice daily (sulfamethoxazole) for infection  Apply warm compresses to the face several times today and tomorrow  If any pus drains you can squeeze it out.  Return if worse at any time, otherwise no follow-up is needed.     Jaysie Benthall, MD 02/21/2015

## 2015-02-24 LAB — WOUND CULTURE
GRAM STAIN: NONE SEEN
Gram Stain: NONE SEEN
ORGANISM ID, BACTERIA: NO GROWTH

## 2015-05-30 ENCOUNTER — Other Ambulatory Visit: Payer: Self-pay | Admitting: Urology

## 2015-06-27 ENCOUNTER — Encounter (HOSPITAL_BASED_OUTPATIENT_CLINIC_OR_DEPARTMENT_OTHER): Payer: Self-pay | Admitting: *Deleted

## 2015-06-28 ENCOUNTER — Encounter (HOSPITAL_BASED_OUTPATIENT_CLINIC_OR_DEPARTMENT_OTHER): Payer: Self-pay | Admitting: *Deleted

## 2015-06-28 NOTE — Progress Notes (Signed)
SPOKE W/ PT'S WIFE.  NPO AFTER MN.  ARRIVE AT 1030.  NEEDS HG.

## 2015-06-30 ENCOUNTER — Ambulatory Visit (HOSPITAL_BASED_OUTPATIENT_CLINIC_OR_DEPARTMENT_OTHER)
Admission: RE | Admit: 2015-06-30 | Discharge: 2015-06-30 | Disposition: A | Discharge status: Home / Self Care | Payer: Managed Care, Other (non HMO) | Source: Ambulatory Visit | Attending: Urology | Admitting: Urology

## 2015-06-30 ENCOUNTER — Ambulatory Visit (HOSPITAL_BASED_OUTPATIENT_CLINIC_OR_DEPARTMENT_OTHER): Payer: Managed Care, Other (non HMO) | Admitting: Anesthesiology

## 2015-06-30 ENCOUNTER — Encounter (HOSPITAL_BASED_OUTPATIENT_CLINIC_OR_DEPARTMENT_OTHER)
Admission: RE | Disposition: A | Discharge status: Home / Self Care | Payer: Self-pay | Source: Ambulatory Visit | Attending: Urology

## 2015-06-30 ENCOUNTER — Encounter (HOSPITAL_BASED_OUTPATIENT_CLINIC_OR_DEPARTMENT_OTHER): Payer: Self-pay | Admitting: Anesthesiology

## 2015-06-30 DIAGNOSIS — N433 Hydrocele, unspecified: Secondary | ICD-10-CM | POA: Insufficient documentation

## 2015-06-30 DIAGNOSIS — Z87891 Personal history of nicotine dependence: Secondary | ICD-10-CM | POA: Diagnosis not present

## 2015-06-30 DIAGNOSIS — Z85831 Personal history of malignant neoplasm of soft tissue: Secondary | ICD-10-CM | POA: Diagnosis not present

## 2015-06-30 DIAGNOSIS — T783XXA Angioneurotic edema, initial encounter: Secondary | ICD-10-CM | POA: Diagnosis not present

## 2015-06-30 HISTORY — DX: Personal history of malignant neoplasm of soft tissue: Z85.831

## 2015-06-30 HISTORY — DX: Angioneurotic edema, initial encounter: T78.3XXA

## 2015-06-30 HISTORY — PX: HYDROCELE EXCISION: SHX482

## 2015-06-30 HISTORY — DX: Hydrocele, unspecified: N43.3

## 2015-06-30 LAB — POCT HEMOGLOBIN-HEMACUE: Hemoglobin: 16 g/dL (ref 13.0–17.0)

## 2015-06-30 SURGERY — HYDROCELECTOMY
Anesthesia: General | Laterality: Right

## 2015-06-30 MED ORDER — CEFAZOLIN SODIUM-DEXTROSE 2-3 GM-% IV SOLR
2.0000 g | INTRAVENOUS | Status: AC
  Administered 2015-06-30: 2 g via INTRAVENOUS
  Filled 2015-06-30: qty 50

## 2015-06-30 MED ORDER — CEFAZOLIN SODIUM-DEXTROSE 2-3 GM-% IV SOLR
INTRAVENOUS | Status: AC
  Filled 2015-06-30: qty 50

## 2015-06-30 MED ORDER — FENTANYL CITRATE (PF) 100 MCG/2ML IJ SOLN
INTRAMUSCULAR | Status: DC | PRN
  Administered 2015-06-30: 25 ug via INTRAVENOUS
  Administered 2015-06-30: 50 ug via INTRAVENOUS
  Administered 2015-06-30: 25 ug via INTRAVENOUS

## 2015-06-30 MED ORDER — FENTANYL CITRATE (PF) 100 MCG/2ML IJ SOLN
25.0000 ug | INTRAMUSCULAR | Status: DC | PRN
  Administered 2015-06-30 (×3): 25 ug via INTRAVENOUS
  Filled 2015-06-30: qty 1

## 2015-06-30 MED ORDER — OXYCODONE HCL 5 MG PO TABS: 5.0000 mg | ORAL_TABLET | ORAL | Status: DC | PRN

## 2015-06-30 MED ORDER — MIDAZOLAM HCL 5 MG/5ML IJ SOLN
INTRAMUSCULAR | Status: DC | PRN
  Administered 2015-06-30: 2 mg via INTRAVENOUS

## 2015-06-30 MED ORDER — PROPOFOL 10 MG/ML IV BOLUS
INTRAVENOUS | Status: AC
  Filled 2015-06-30: qty 20

## 2015-06-30 MED ORDER — KETOROLAC TROMETHAMINE 30 MG/ML IJ SOLN
INTRAMUSCULAR | Status: AC
  Filled 2015-06-30: qty 1

## 2015-06-30 MED ORDER — LIDOCAINE HCL (CARDIAC) 20 MG/ML IV SOLN
INTRAVENOUS | Status: AC
  Filled 2015-06-30: qty 5

## 2015-06-30 MED ORDER — BUPIVACAINE HCL (PF) 0.25 % IJ SOLN
INTRAMUSCULAR | Status: DC | PRN
  Administered 2015-06-30: 10 mL

## 2015-06-30 MED ORDER — MIDAZOLAM HCL 2 MG/2ML IJ SOLN
INTRAMUSCULAR | Status: AC
  Filled 2015-06-30: qty 2

## 2015-06-30 MED ORDER — PROMETHAZINE HCL 25 MG/ML IJ SOLN
6.2500 mg | INTRAMUSCULAR | Status: DC | PRN
  Filled 2015-06-30: qty 1

## 2015-06-30 MED ORDER — LIDOCAINE HCL (CARDIAC) 20 MG/ML IV SOLN
INTRAVENOUS | Status: DC | PRN
Start: 2015-06-30 — End: 2015-06-30
  Administered 2015-06-30: 100 mg via INTRAVENOUS

## 2015-06-30 MED ORDER — DEXAMETHASONE SODIUM PHOSPHATE 10 MG/ML IJ SOLN
INTRAMUSCULAR | Status: AC
  Filled 2015-06-30: qty 1

## 2015-06-30 MED ORDER — ONDANSETRON HCL 4 MG/2ML IJ SOLN
INTRAMUSCULAR | Status: AC
Start: 2015-06-30 — End: 2015-06-30
  Filled 2015-06-30: qty 2

## 2015-06-30 MED ORDER — DEXAMETHASONE SODIUM PHOSPHATE 4 MG/ML IJ SOLN
INTRAMUSCULAR | Status: DC | PRN
  Administered 2015-06-30: 10 mg via INTRAVENOUS

## 2015-06-30 MED ORDER — ACETAMINOPHEN 10 MG/ML IV SOLN
INTRAVENOUS | Status: DC | PRN
  Administered 2015-06-30: 1000 mg via INTRAVENOUS

## 2015-06-30 MED ORDER — FENTANYL CITRATE (PF) 100 MCG/2ML IJ SOLN
INTRAMUSCULAR | Status: AC
  Filled 2015-06-30: qty 2

## 2015-06-30 MED ORDER — PROPOFOL 10 MG/ML IV BOLUS
INTRAVENOUS | Status: DC | PRN
  Administered 2015-06-30: 200 mg via INTRAVENOUS

## 2015-06-30 MED ORDER — LACTATED RINGERS IV SOLN
INTRAVENOUS | Status: DC
  Administered 2015-06-30 (×2): via INTRAVENOUS
  Filled 2015-06-30: qty 1000

## 2015-06-30 MED ORDER — OXYCODONE HCL 5 MG PO TABS
5.0000 mg | ORAL_TABLET | Freq: Once | ORAL | Status: AC
  Administered 2015-06-30: 5 mg via ORAL
  Filled 2015-06-30: qty 1

## 2015-06-30 MED ORDER — ONDANSETRON HCL 4 MG/2ML IJ SOLN
INTRAMUSCULAR | Status: DC | PRN
  Administered 2015-06-30: 4 mg via INTRAVENOUS

## 2015-06-30 MED ORDER — OXYCODONE HCL 5 MG PO TABS
ORAL_TABLET | ORAL | Status: AC
  Filled 2015-06-30: qty 1

## 2015-06-30 SURGICAL SUPPLY — 48 items
BLADE CLIPPER SURG (BLADE) ×3 IMPLANT
BLADE SURG 15 STRL LF DISP TIS (BLADE) ×1 IMPLANT
BLADE SURG 15 STRL SS (BLADE) ×2
BNDG GAUZE ELAST 4 BULKY (GAUZE/BANDAGES/DRESSINGS) ×3 IMPLANT
BRIEF STRETCH FOR OB PAD LRG (UNDERPADS AND DIAPERS) ×3 IMPLANT
CANISTER SUCTION 1200CC (MISCELLANEOUS) IMPLANT
CANISTER SUCTION 2500CC (MISCELLANEOUS) ×3 IMPLANT
CLEANER CAUTERY TIP 5X5 PAD (MISCELLANEOUS) ×1 IMPLANT
COVER BACK TABLE 60X90IN (DRAPES) ×3 IMPLANT
COVER MAYO STAND STRL (DRAPES) ×3 IMPLANT
DISSECTOR ROUND CHERRY 3/8 STR (MISCELLANEOUS) IMPLANT
DRAIN PENROSE 18X1/4 LTX STRL (WOUND CARE) IMPLANT
DRAPE PED LAPAROTOMY (DRAPES) ×3 IMPLANT
DRSG KUZMA FLUFF (GAUZE/BANDAGES/DRESSINGS) ×3 IMPLANT
ELECT NEEDLE BLADE 2-5/6 (NEEDLE) ×3 IMPLANT
ELECT REM PT RETURN 9FT ADLT (ELECTROSURGICAL) ×3
ELECTRODE REM PT RTRN 9FT ADLT (ELECTROSURGICAL) ×1 IMPLANT
GAUZE SPONGE 4X4 16PLY XRAY LF (GAUZE/BANDAGES/DRESSINGS) ×3 IMPLANT
GLOVE BIO SURGEON STRL SZ7.5 (GLOVE) ×3 IMPLANT
GLOVE ECLIPSE 6.5 STRL STRAW (GLOVE) ×3 IMPLANT
GLOVE INDICATOR 6.5 STRL GRN (GLOVE) ×3 IMPLANT
GLOVE INDICATOR 7.5 STRL GRN (GLOVE) ×3 IMPLANT
GOWN STRL REUS W/ TWL LRG LVL3 (GOWN DISPOSABLE) ×2 IMPLANT
GOWN STRL REUS W/ TWL XL LVL3 (GOWN DISPOSABLE) ×1 IMPLANT
GOWN STRL REUS W/TWL LRG LVL3 (GOWN DISPOSABLE) ×4
GOWN STRL REUS W/TWL XL LVL3 (GOWN DISPOSABLE) ×2
KIT ROOM TURNOVER WOR (KITS) ×3 IMPLANT
LIQUID BAND (GAUZE/BANDAGES/DRESSINGS) ×3 IMPLANT
NEEDLE HYPO 22GX1.5 SAFETY (NEEDLE) ×3 IMPLANT
NS IRRIG 500ML POUR BTL (IV SOLUTION) ×3 IMPLANT
PACK BASIN DAY SURGERY FS (CUSTOM PROCEDURE TRAY) ×3 IMPLANT
PAD CLEANER CAUTERY TIP 5X5 (MISCELLANEOUS) ×2
PENCIL BUTTON HOLSTER BLD 10FT (ELECTRODE) ×3 IMPLANT
SUPPORT SCROTAL LG STRP (MISCELLANEOUS) ×2 IMPLANT
SUPPORTER ATHLETIC LG (MISCELLANEOUS) ×1
SUT MNCRL AB 4-0 PS2 18 (SUTURE) ×3 IMPLANT
SUT VIC AB 3-0 SH 27 (SUTURE) ×4
SUT VIC AB 3-0 SH 27X BRD (SUTURE) ×2 IMPLANT
SUT VICRYL 3 0 BR 18  UND (SUTURE) ×2
SUT VICRYL 3 0 BR 18 UND (SUTURE) ×1 IMPLANT
SYR BULB IRRIGATION 50ML (SYRINGE) ×3 IMPLANT
SYR CONTROL 10ML LL (SYRINGE) ×3 IMPLANT
TOWEL OR 17X24 6PK STRL BLUE (TOWEL DISPOSABLE) ×6 IMPLANT
TRAY DSU PREP LF (CUSTOM PROCEDURE TRAY) ×3 IMPLANT
TUBE CONNECTING 12'X1/4 (SUCTIONS) ×1
TUBE CONNECTING 12X1/4 (SUCTIONS) ×2 IMPLANT
WATER STERILE IRR 500ML POUR (IV SOLUTION) ×3 IMPLANT
YANKAUER SUCT BULB TIP NO VENT (SUCTIONS) ×3 IMPLANT

## 2015-06-30 NOTE — Discharge Instructions (Signed)
Discharge instructions following scrotal surgery  Call your doctor for:  Fever is greater than 100.5  Severe nausea or vomiting  Increasing pain not controlled by pain medication  Increasing redness or drainage from incisions  The number for questions or concerns is [phone]  Activity level: No lifting greater than 10 pounds (about equal to milk) for the next 2 weeks or until cleared to do so at follow-up appointment.  Otherwise activity as tolerated by comfort level.  Diet: May resume your regular diet as tolerated  Driving: No driving while still taking opiate pain medications (weight at least 6-8 hours after last dose).  No driving if you still sore from surgery as it may limit her ability to react quickly if necessary.   Shower/bath: May shower and get incision wet pad dry immediately following.  Do not scrub vigorously for the next 2-3 weeks.  Do not soak incision (ID soaking in bath or swimming) until told he may do so by Dr., as this may promote a wound infection.  Wound care: He may cover wounds with sterile gauze as needed to prevent incisions rubbing on close follow-up in any seepage.  Where tight fitting underpants for at least 2 weeks.  He should apply cold compresses (ice or sac of frozen peas/corn) to your scrotum for at least 48 hours to reduce the swelling.  You should expect that his scrotum will swell up initially and then get smaller over the next 2-4 weeks.  Follow-up appointments: Follow-up appointment will be scheduled with Dr. Louis Meckel in 6 weeks for a wound check.     Post Anesthesia Home Care Instructions  Activity: Get plenty of rest for the remainder of the day. A responsible adult should stay with you for 24 hours following the procedure.  For the next 24 hours, DO NOT: -Drive a car -Paediatric nurse -Drink alcoholic beverages -Take any medication unless instructed by your physician -Make any legal decisions or sign important  papers.  Meals: Start with liquid foods such as gelatin or soup. Progress to regular foods as tolerated. Avoid greasy, spicy, heavy foods. If nausea and/or vomiting occur, drink only clear liquids until the nausea and/or vomiting subsides. Call your physician if vomiting continues.  Special Instructions/Symptoms: Your throat may feel dry or sore from the anesthesia or the breathing tube placed in your throat during surgery. If this causes discomfort, gargle with warm salt water. The discomfort should disappear within 24 hours.  If you had a scopolamine patch placed behind your ear for the management of post- operative nausea and/or vomiting:  1. The medication in the patch is effective for 72 hours, after which it should be removed.  Wrap patch in a tissue and discard in the trash. Wash hands thoroughly with soap and water. 2. You may remove the patch earlier than 72 hours if you experience unpleasant side effects which may include dry mouth, dizziness or visual disturbances. 3. Avoid touching the patch. Wash your hands with soap and water after contact with the patch.

## 2015-06-30 NOTE — Anesthesia Postprocedure Evaluation (Signed)
Anesthesia Post Note  Patient: Caren Macadam  Procedure(s) Performed: Procedure(s) (LRB): HYDROCELECTOMY ADULT (Right)  Patient location during evaluation: PACU Anesthesia Type: General Level of consciousness: awake and alert Pain management: pain level controlled Vital Signs Assessment: post-procedure vital signs reviewed and stable Respiratory status: spontaneous breathing, nonlabored ventilation, respiratory function stable and patient connected to nasal cannula oxygen Cardiovascular status: blood pressure returned to baseline and stable Postop Assessment: no signs of nausea or vomiting Anesthetic complications: no    Last Vitals:  Filed Vitals:   06/30/15 1439 06/30/15 1445  BP:    Pulse: 74 76  Temp:    Resp: 17 17    Last Pain:  Filed Vitals:   06/30/15 1448  PainSc: 6                  Jameir Ake

## 2015-06-30 NOTE — H&P (Signed)
Reason For Visit right hydrocele   History of Present Illness 47M initially seen by Dr. Janice Norrie in July 2016 for evaluation and management of a right hydrocele. He had an u/s to support diagnosis. He was presented with the treatment options at that time and recommended conservative management given his lack of symptoms. He presents today for further discussion. Patient states that over the past for 5 months he has started to become more symptomatic. In particular, the patient states that he will often sit on his scrotum which causes significant pain. In addition, the stretch of his scrotum has changed his erections some. He also was having difficulty riding his bike. He is eager to get this taken care of.    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 This was in 2007 and was treated with surgery followed by radiation. He then had a recurrence in his liver which was resected in 2012. At this point, he is still being actively monitored with CT scans.   Past Medical History Problems  1. History of Leiomyosarcoma (C49.9)  Surgical History Problems  1. History of Excision Of The Leg 2. History of Liver Surgery  Current Meds 1. No Reported Medications Recorded  Allergies Medication  1. No Known Drug Allergies  Family History Problems  1. Family history of cerebrovascular accident (CVA) (Z82.3) : Mother  Social History Problems  1. Alcohol use (Z78.9)   2 2. Caffeine use (F15.90) 3. Father deceased   46 cancer unspecified 68. Former smoker [phone])   smoked 1 pk for 10 yrs / quit 10 yrs ago 5. Married 6. Mother deceased   60 stroke 7. Occupation   Press photographer (boats)  Vitals Vital Signs [Data Includes: Last 1 Day]  Recorded: [id number] 01:05PM  Blood Pressure: 139 / 87 Temperature: 98.1 F Heart Rate: 59  Physical Exam Constitutional: Well nourished and well developed . No acute  distress.  Pulmonary: No respiratory distress and normal respiratory rhythm and effort.  Cardiovascular: Heart rate and rhythm are normal . No peripheral edema.  Genitourinary: Examination of the penis demonstrates no lesions. The penis is uncircumcised. The scrotum is normal in appearance. Examination of the right scrotum demonstrates a hydrocele. The left epididymis is palpably normal. The right testis is nonpalpable. The left testis is normal.  Lymphatics: The femoral and inguinal nodes are not enlarged or tender.  Skin: Normal skin turgor, no visible rash and no visible skin lesions.  Neuro/Psych:. Mood and affect are appropriate.    Results/Data Urine [Data Includes: Last 1 Day]   [id number]  COLOR YELLOW   APPEARANCE CLEAR   SPECIFIC GRAVITY 1.020   pH 7.0   GLUCOSE NEGATIVE   BILIRUBIN NEGATIVE   KETONE NEGATIVE   BLOOD NEGATIVE   PROTEIN NEGATIVE   NITRITE NEGATIVE   LEUKOCYTE ESTERASE NEGATIVE    I have reviewed his u/s from 01/2015 demonstrating a simple right sided hydrocele.   Assessment Assessed  1. Hydrocele, right (N43.3)  Plan Health Maintenance  1. UA With REFLEX; [Do Not Release]; Status:Resulted - Requires Verification;   [id number] 12:56PM Hydrocele, right  2. Follow-up Schedule Surgery Office  Follow-up  Status: Hold For - Appointment   Requested for: [id number]  Discussion/Summary The patient has a right hydrocele which has become more symptomatic over the last several months. The patient is eager to take care of this. Given his history of lipoma or sarcoma  with associated angioedema I'm concerned that his risk of recurrence of his hydrocele is significantly elevated compared to the average population. We talked about the surgery in detail. We also talked about the expected recovery time. We went over the complications associated with that. I will gone over the risks and the benefits of these procedure, the patient would like to proceed. We'll try to get  this scheduled at the patient's convenience.

## 2015-06-30 NOTE — Anesthesia Preprocedure Evaluation (Signed)
Anesthesia Evaluation  Patient identified by MRN, date of birth, ID band Patient awake    Reviewed: Allergy & Precautions, NPO status , Patient's Chart, lab work & pertinent test results  Airway Mallampati: II  TM Distance: >3 FB Neck ROM: Full    Dental no notable dental hx.    Pulmonary former smoker,    Pulmonary exam normal breath sounds clear to auscultation       Cardiovascular Exercise Tolerance: Good negative cardio ROS Normal cardiovascular exam+ Valvular Problems/Murmurs  Rhythm:Regular Rate:Normal     Neuro/Psych negative neurological ROS  negative psych ROS   GI/Hepatic negative GI ROS, Neg liver ROS,   Endo/Other  negative endocrine ROS  Renal/GU negative Renal ROS  negative genitourinary   Musculoskeletal negative musculoskeletal ROS (+)   Abdominal   Peds negative pediatric ROS (+)  Hematology negative hematology ROS (+)   Anesthesia Other Findings   Reproductive/Obstetrics negative OB ROS                             Anesthesia Physical Anesthesia Plan  ASA: II  Anesthesia Plan: General   Post-op Pain Management:    Induction: Intravenous  Airway Management Planned: LMA  Additional Equipment:   Intra-op Plan:   Post-operative Plan: Extubation in OR  Informed Consent: I have reviewed the patients History and Physical, chart, labs and discussed the procedure including the risks, benefits and alternatives for the proposed anesthesia with the patient or authorized representative who has indicated his/her understanding and acceptance.   Dental advisory given  Plan Discussed with: CRNA  Anesthesia Plan Comments:         Anesthesia Quick Evaluation

## 2015-06-30 NOTE — Transfer of Care (Signed)
  Last Vitals:  Filed Vitals:   06/30/15 1055  BP: 139/75  Pulse: 77  Temp: 36.9 C  Resp: 14   Immediate Anesthesia Transfer of Care Note  Patient: [name] Shelton  Procedure(s) Performed: Procedure(s) (LRB): HYDROCELECTOMY ADULT (Right)  Patient Location: PACU  Anesthesia Type: General  Level of Consciousness: awake, alert  and oriented  Airway & Oxygen Therapy: Patient Spontanous Breathing and Patient connected to face mask oxygen  Post-op Assessment: Report given to PACU RN and Post -op Vital signs reviewed and stable  Post vital signs: Reviewed and stable  Complications: No apparent anesthesia complications

## 2015-06-30 NOTE — Anesthesia Procedure Notes (Addendum)
Procedure Name: LMA Insertion Date/Time: 06/30/2015 12:53 PM Performed by: Mechele Claude Pre-anesthesia Checklist: Patient identified, Timeout performed, Emergency Drugs available, Suction available and Patient being monitored Patient Re-evaluated:Patient Re-evaluated prior to inductionOxygen Delivery Method: Circle system utilized Preoxygenation: Pre-oxygenation with 100% oxygen Intubation Type: IV induction Ventilation: Mask ventilation without difficulty LMA: LMA inserted LMA Size: 5.0 Number of attempts: 1 Placement Confirmation: positive ETCO2 and breath sounds checked- equal and bilateral Tube secured with: Tape Dental Injury: Teeth and Oropharynx as per pre-operative assessment    Performed by: Mechele Claude

## 2015-06-30 NOTE — Op Note (Signed)
Preoperative diagnosis:  1. right hydrocele   Postoperative diagnosis:  1. right hydrocele  Procedure:       right hydrocelectomy  Surgeon: Ardis Hughs, MD  Anesthesia: General  Complications: None  Intraoperative findings: irrigated with betadine solution once sac was excised to minimize risks of recurrence and encourage sclerosis  EBL: Minimal  Specimens: None  Indication: Indication: [name] Shelton is a 39 y.o. patient with right hydrocele.  After reviewing the management options for treatment, he elected to proceed with the above surgical procedure(s). We have discussed the potential benefits and risks of the procedure, side effects of the proposed treatment, the likelihood of the patient achieving the goals of the procedure, and any potential problems that might occur during the procedure or recuperation. Informed consent has been obtained.  Description of procedure:  The patient was taken to the operating room and general anesthesia was induced. The patient was placed on the table in supine position, general anesthesia was then induced and an LMA inserted. The scrotum was then prepped and draped in the routine sterile fashion. A timeout was then held with confirmation of antibiotics.  I then made a midline incision through the scrotal median raphae through the skin and into the dartos. Once through several layers the dartos was able to get the right testicle and contents out of the right  hemiscrotum and into the surgical field.    The hydrocele sac was then dissected out removing the overlying layers of the dartos tunica.  The sac was then opened with Metzenbaum scissors and the fluid drained from the sac.  The opening was then continued so that the entire sac was bivalved.  The edges were then removed, leaving a small edge of tissue around the testicle.  The edge of the remaining sac was cauterized.  The edge was then everted, brought together around the posterior  aspect of the testicle and closed in a running fashion using 3-0  Vicryl.  The distal aspect was left open to prevent strangulating the cord.   Meticulous hemostasis was then achieved. The right hemiscrotum was then copiously irrigated and a final check for hemostasis performed. I then closed the dartos with a 3-0 Vicryl in a running/locking stitch. I closed the skin with a 4-0 Monocryl in a vertical mattress running fashion. I then injected 10 cc of quarter percent Marcaine into the incision, and then placed Dermabond over the incision. A fluff dressing and mesh underpants were then applied area.  The patient tolerated the procedure without any perioperative complications. At the end of the case all last needles and sponges had been accounted for. The patient was returned to the PACU in excellent condition.   Ardis Hughs, M.D.

## 2015-07-03 ENCOUNTER — Encounter (HOSPITAL_BASED_OUTPATIENT_CLINIC_OR_DEPARTMENT_OTHER): Payer: Self-pay | Admitting: Urology

## 2015-09-21 IMAGING — US US SCROTUM
1 series · 14 of 25 positions shown · non-contrast
Comparison: None.

CLINICAL DATA: Initial encounter for right testicular mass.

EXAM:
ULTRASOUND OF SCROTUM
TECHNIQUE: Complete ultrasound examination of the testicles, epididymis, and
other scrotal structures was performed.

[Series 1: us scrotum · 0.10mm/px · 14 of 40 slices shown]
[im 1/40]
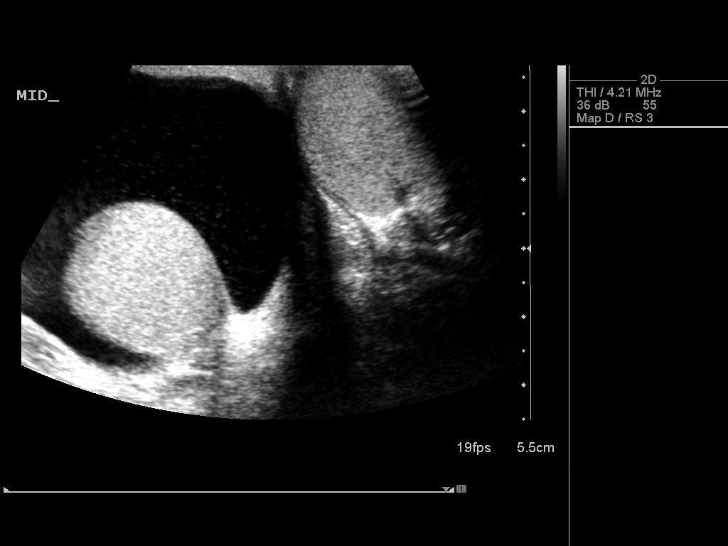
[im 4/40]
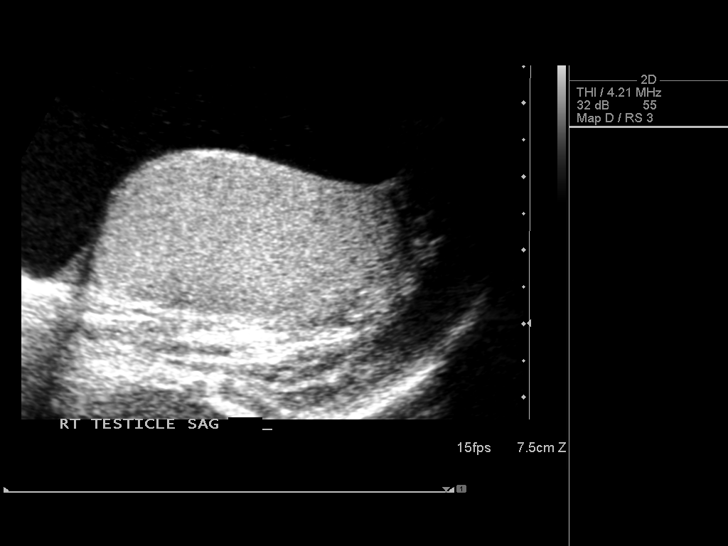
[im 7/40]
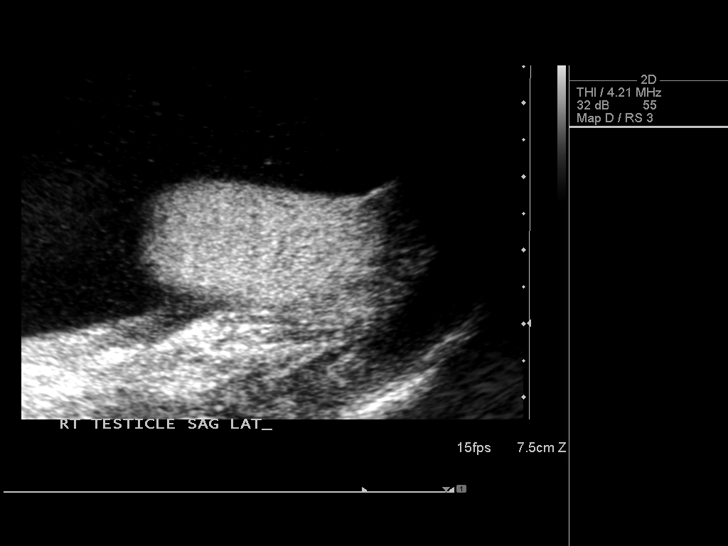
[im 10/40]
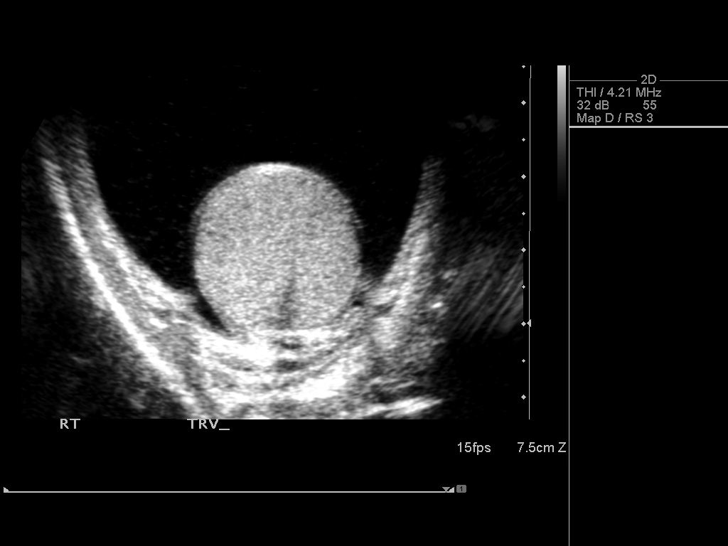
[im 14/40]
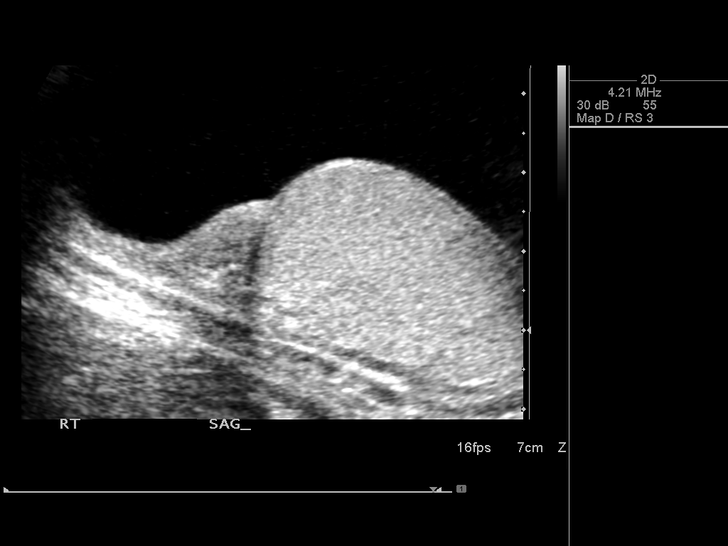
[im 15/40]
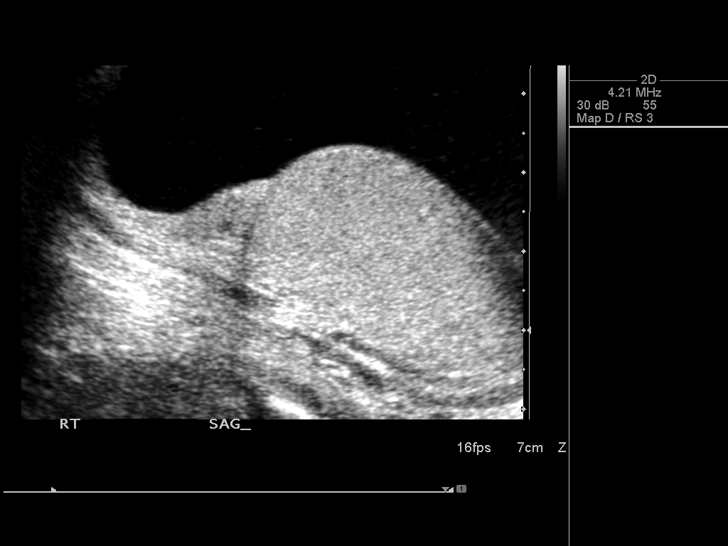
[im 18/40]
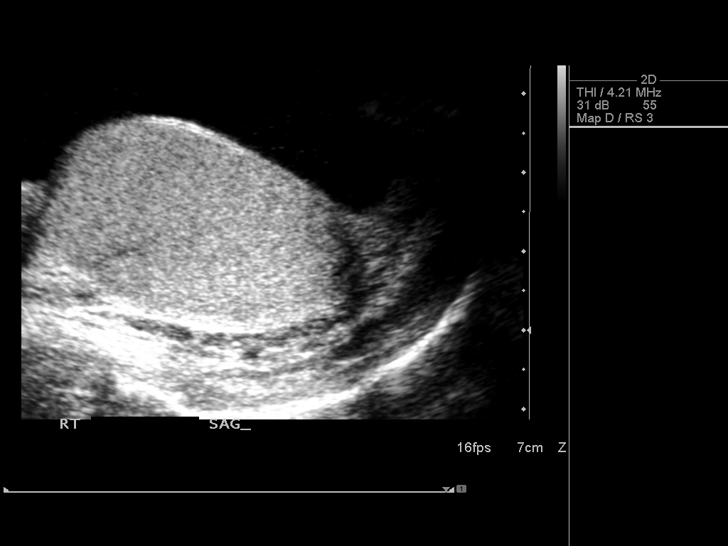
[im 22/40]
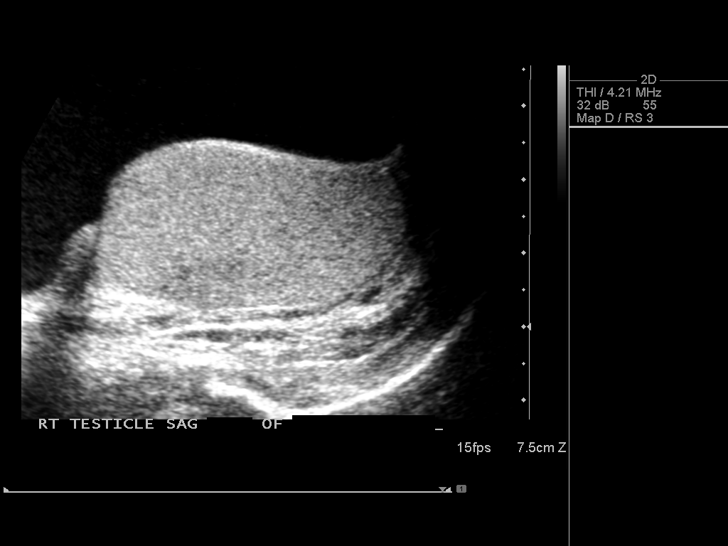
[im 25/40]
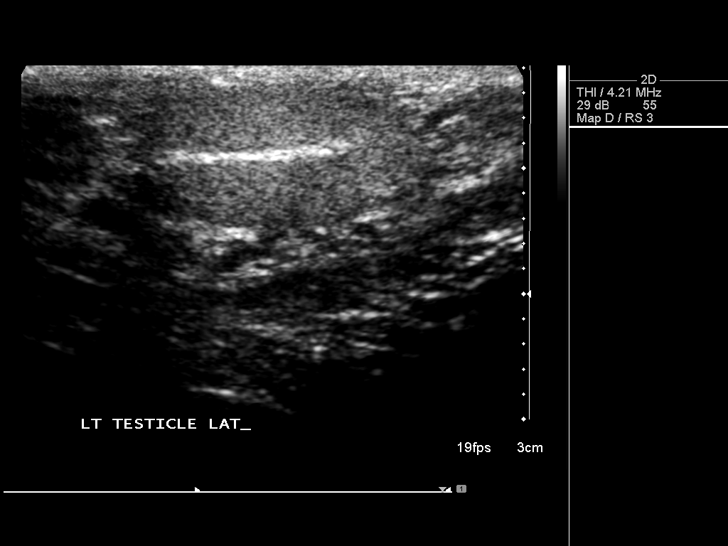
[im 27/40]
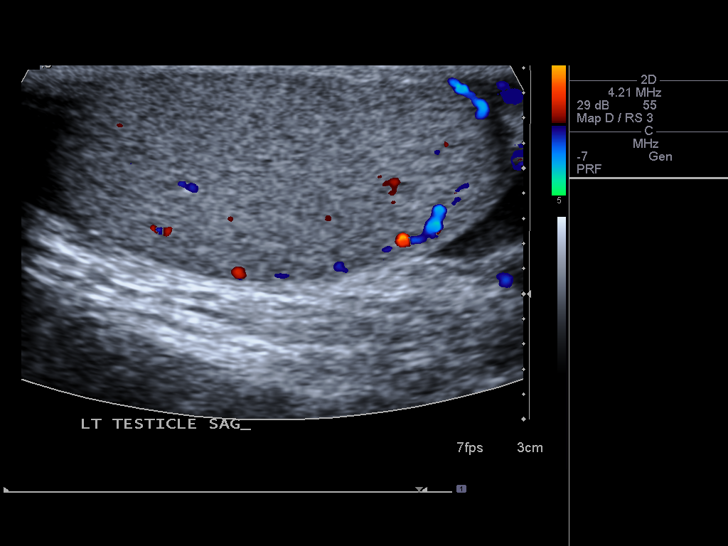
[im 30/40]
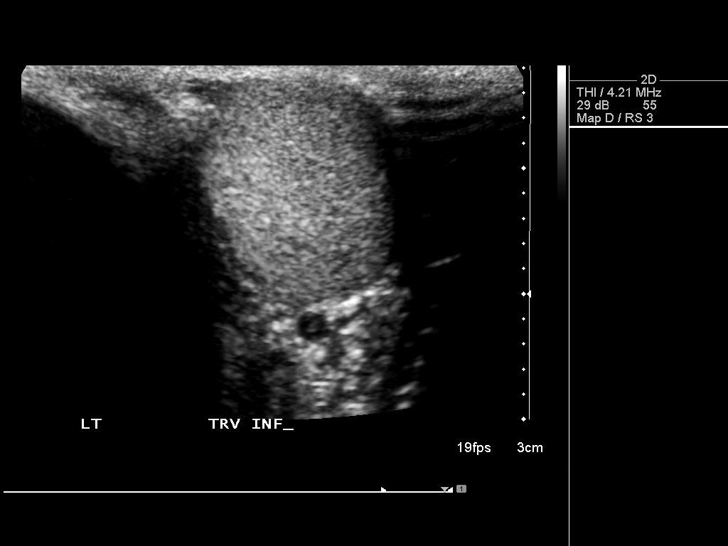
[im 33/40]
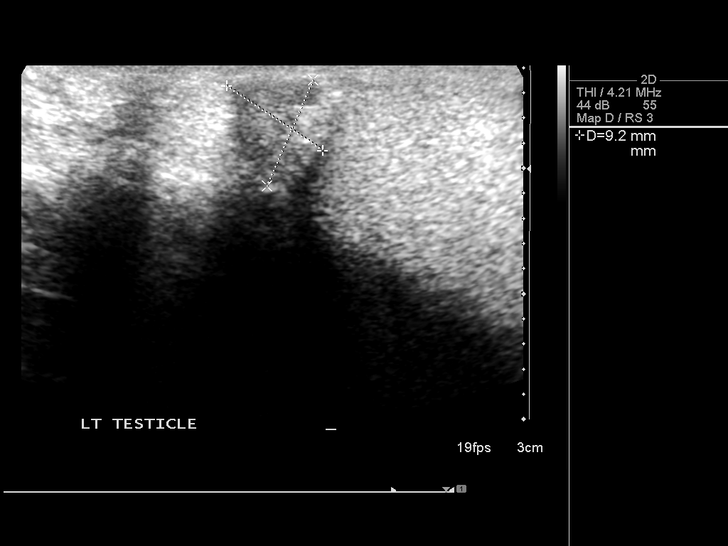
[im 36/40]
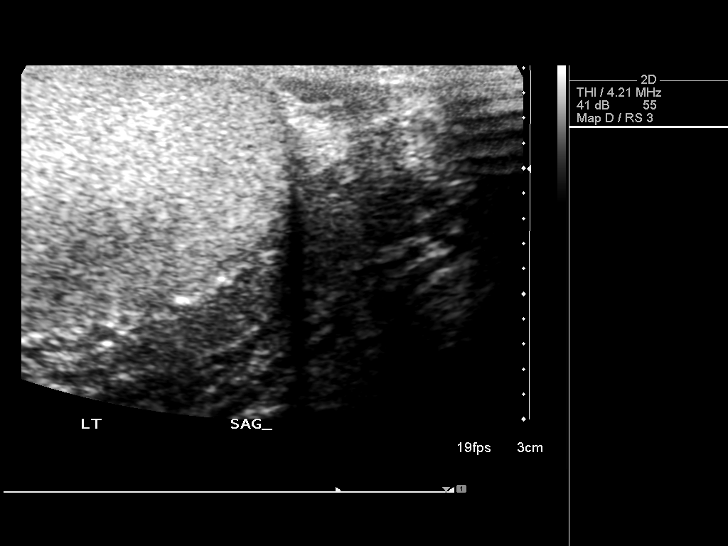
[im 40/40]
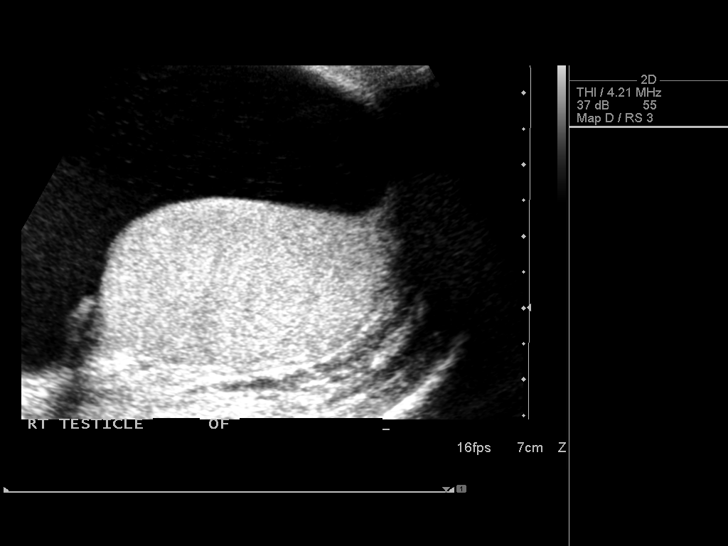

[14 of 25 positions shown; findings below may reference images not displayed]

FINDINGS: Right testicle

Measurements: 4.3 x 2.2 x 2.3 cm. No mass or microlithiasis
visualized.

Left testicle

Measurements: 3.5 x 1.8 x 2.3 cm. No mass or microlithiasis
visualized.

Right epididymis:  Normal in size and appearance.

Left epididymis:  Normal in size and appearance.

Hydrocele: Without evidence for septation or internal debris. No
hydrocele on the left.

Varicocele:  None visualized.
IMPRESSION: Large simple appearing right hydrocele. No right-sided
intratesticular mass lesion.

## 2019-12-14 DIAGNOSIS — K4090 Unilateral inguinal hernia, without obstruction or gangrene, not specified as recurrent: Secondary | ICD-10-CM | POA: Insufficient documentation

## 2021-09-03 DIAGNOSIS — M5412 Radiculopathy, cervical region: Secondary | ICD-10-CM | POA: Insufficient documentation

## 2021-10-16 DIAGNOSIS — G5620 Lesion of ulnar nerve, unspecified upper limb: Secondary | ICD-10-CM | POA: Insufficient documentation

## 2021-11-07 DIAGNOSIS — M899 Disorder of bone, unspecified: Secondary | ICD-10-CM | POA: Insufficient documentation

## 2021-11-30 DIAGNOSIS — C7951 Secondary malignant neoplasm of bone: Secondary | ICD-10-CM | POA: Insufficient documentation

## 2021-11-30 DIAGNOSIS — C499 Malignant neoplasm of connective and soft tissue, unspecified: Secondary | ICD-10-CM | POA: Insufficient documentation

## 2022-02-12 HISTORY — PX: OTHER SURGICAL HISTORY: SHX169

## 2022-04-09 DIAGNOSIS — Z483 Aftercare following surgery for neoplasm: Secondary | ICD-10-CM | POA: Insufficient documentation

## 2022-08-05 ENCOUNTER — Encounter: Payer: Self-pay | Admitting: Internal Medicine

## 2022-08-05 ENCOUNTER — Ambulatory Visit (INDEPENDENT_AMBULATORY_CARE_PROVIDER_SITE_OTHER): Payer: 59 | Admitting: Internal Medicine

## 2022-08-05 VITALS — BP 139/87 | HR 70 | Temp 97.6°F | Ht 74.0 in | Wt 198.2 lb

## 2022-08-05 DIAGNOSIS — Z1211 Encounter for screening for malignant neoplasm of colon: Secondary | ICD-10-CM

## 2022-08-05 DIAGNOSIS — C7951 Secondary malignant neoplasm of bone: Secondary | ICD-10-CM

## 2022-08-05 DIAGNOSIS — C499 Malignant neoplasm of connective and soft tissue, unspecified: Secondary | ICD-10-CM

## 2022-08-05 DIAGNOSIS — T451X5A Adverse effect of antineoplastic and immunosuppressive drugs, initial encounter: Secondary | ICD-10-CM

## 2022-08-05 DIAGNOSIS — R112 Nausea with vomiting, unspecified: Secondary | ICD-10-CM

## 2022-08-05 DIAGNOSIS — M7989 Other specified soft tissue disorders: Secondary | ICD-10-CM

## 2022-08-05 DIAGNOSIS — E663 Overweight: Secondary | ICD-10-CM

## 2022-08-05 MED ORDER — DRONABINOL 2.5 MG PO CAPS: 2.5000 mg | ORAL_CAPSULE | Freq: Two times a day (BID) | ORAL | 0 refills | Status: DC

## 2022-08-05 NOTE — Assessment & Plan Note (Signed)
Worsening, despite Zofran, associated with liver injury We discussed this is a  dronabinol indication he agreed to try low dose.

## 2022-08-05 NOTE — Addendum Note (Signed)
Addended by: Loralee Pacas on: 08/05/2022 07:13 PM   Modules accepted: Level of Service

## 2022-08-05 NOTE — Progress Notes (Signed)
[place]  Phone: [phone]  New patient visit  Visit Date: 08/05/2022 Patient: [name] Shelton   DOB: 07-Dec-1975   47 y.o. Male  MRN: [id number]  Today's healthcare provider: Loralee Pacas, MD  Assessment and Plan:    [name] Shelton was seen today for establish care.  Chemotherapy induced nausea and vomiting Assessment & Plan: Worsening, despite Zofran, associated with liver injury We discussed this is a  dronabinol indication he agreed to try low dose.   Orders: -     droNABinol; Take 1 capsule (2.5 mg total) by mouth 2 (two) times daily before a meal.  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  Had adjuvant radiation and stayed in remission in 5 years, then distant met recurrence 2012 in liver , 30 % liver removed,  returned in liver 2020 and they took 25% more, and again in 2023 there was another resection, and there is plan  Pain in R arm may 2023- mri found metastases to R arm humeral bone marrow on liver resection at that time. Bone scraped and radiated. And chemo started about 07/16/2022 the residual liver tumors will be used to assess response to chemo.     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  I encouraged him to return soon but preferably after chemo course is done, for a Annual Preventive Medicine and Wellness Visit.  Subjective:  Patient presents today to establish care.  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  Had adjuvant radiation and stayed in remission in 5 years, then distant met recurrence 2012 in liver , 30 % liver removed,  returned in liver 2020 and they took 25% more, and again in 2023 there was another resection, and there is plan  Pain in R arm may 2023- mri found metastases to R arm humeral bone marrow on liver resection at that time. Bone scraped and radiated. And chemo started about 07/16/2022 the residual liver tumors will be used to assess response to chemo.     Aftercare Following Surgery for Neoplasm (Resolved)     Depression Screen    08/05/2022    1:21 PM 02/21/2015    4:36 PM 01/22/2015    2:39 PM  PHQ 2/9 Scores  PHQ - 2 Score 0 0 0   No results found for any visits on 08/05/22.  The following  were reviewed and entered/updated into his MEDICAL RECORD [place] History:  Diagnosis Date   Angioedema    residual right thigh/ lower leg  s/p  excision lymph node and radiation therapy 2006/ 2007   Cancer [facility])    History of sarcoma oncologist-  dr Crisoforo Oxford ([facility])---  no recurrence since 2012   dx 2006  right thigh/ goin  leimyosarcoma (low grade)  s/p  excisional bx right inguinal lymph node(and resection tumor and inguinal lymph node 2007)  and radiation therapy 2007//   2012 recurrent in liver  s/p  resection tumor of liver   Right hydrocele    Past Surgical History:  Procedure Laterality Date    EXCISIONAL BX RIGHT INGUINAL LYMPH NODE  06/19/2005   low grade leimyosarcoma   HYDROCELE EXCISION Right 06/30/2015   Procedure: HYDROCELECTOMY ADULT;  Surgeon: Ardis Hughs, MD;  Location: [facility];  Service: Urology;  Laterality: Right;   Intralesional curretage of tumor in right humerus  02/2022   RESECTION LIVER MASS  07/15/2010   recurrent sarcoma   RESECTION RIGHT INGUINAL LYMPH NODE/ TUMOR  Jan 2012    [facility]   Family History  Adopted: Yes   Outpatient Medications Prior to Visit  Medication Sig Dispense Refill   pazopanib (VOTRIENT) 200 MG tablet Take by mouth.     oxyCODONE (ROXICODONE) 5 MG immediate release tablet Take 1 tablet (5 mg total) by mouth every 4 (four) hours as needed. (Patient not taking: Reported on 08/05/2022) 15 tablet 0   No facility-administered medications prior to visit.    Allergies  Allergen Reactions   Wound Dressing Adhesive Rash    Paper tape ok to use   Social History   Tobacco Use   Smoking status: Former    Packs/day: 0.50    Years: 10.00    Total pack years: 5.00    Types: Cigarettes    Quit date: 07/15/2004    Years since quitting: 18.0   Smokeless tobacco: Never  Vaping Use   Vaping Use: Never used  Substance Use Topics   Alcohol use: Not Currently    Alcohol/week: 6.0 - 12.0 standard drinks of alcohol    Types: 6 - 12 Cans of beer per week   Drug use: Not Currently    Types: Marijuana     There is no immunization history on file for this patient.  Objective:  BP 139/87 (BP Location: Left Arm, Patient Position: Sitting)   Pulse 70   Temp 97.6 F (36.4 C) (Temporal)   Ht '6\' 2"'$  (1.88 m)   Wt 198 lb 3.2 oz (89.9 kg)   SpO2 98%   BMI 25.45 kg/m  Body mass index is 25.45 kg/m. indicates this is an Overweight male , but waist circumference is a better indicator of healthy body composition. He appears to be healthy weight clinically. Physical Exam  Vital signs reviewed.  Nursing notes  reviewed. General Appearance/Constitutional:  polite male in no acute distress Musculoskeletal: All extremities are intact.  Neurological:  Awake, alert,  No obvious focal neurological deficits or cognitive impairments Psychiatric:  Appropriate mood, pleasant demeanor Problem-specific findings:  very handsome and pleasant gentleman with healthy silver hair    Results Reviewed: Results for orders placed or performed during the hospital encounter of 06/30/15  Hemoglobin-hemacue, POC  Result Value Ref Range   Hemoglobin 16.0 13.0 - 17.0 g/dL

## 2022-08-07 ENCOUNTER — Encounter: Payer: Self-pay | Admitting: Internal Medicine

## 2022-08-07 ENCOUNTER — Ambulatory Visit (INDEPENDENT_AMBULATORY_CARE_PROVIDER_SITE_OTHER): Payer: 59 | Admitting: Internal Medicine

## 2022-08-07 VITALS — BP 133/80 | HR 67 | Temp 97.7°F | Ht 74.0 in | Wt 198.2 lb

## 2022-08-07 DIAGNOSIS — L270 Generalized skin eruption due to drugs and medicaments taken internally: Secondary | ICD-10-CM | POA: Diagnosis not present

## 2022-08-07 DIAGNOSIS — K1232 Oral mucositis (ulcerative) due to other drugs: Secondary | ICD-10-CM | POA: Diagnosis not present

## 2022-08-07 DIAGNOSIS — K648 Other hemorrhoids: Secondary | ICD-10-CM

## 2022-08-07 DIAGNOSIS — T451X5S Adverse effect of antineoplastic and immunosuppressive drugs, sequela: Secondary | ICD-10-CM

## 2022-08-07 DIAGNOSIS — K6289 Other specified diseases of anus and rectum: Secondary | ICD-10-CM

## 2022-08-07 HISTORY — DX: Generalized skin eruption due to drugs and medicaments taken internally: L27.0

## 2022-08-07 MED ORDER — PREDNISONE 20 MG PO TABS: ORAL_TABLET | ORAL | 0 refills | Status: DC

## 2022-08-07 MED ORDER — LIDOCAINE-HYDROCORTISONE ACE 3-2.5 % RE KIT: PACK | RECTAL | 0 refills | Status: DC

## 2022-08-07 MED ORDER — OXYCODONE-ACETAMINOPHEN 10-325 MG PO TABS: 1.0000 | ORAL_TABLET | Freq: Three times a day (TID) | ORAL | 0 refills | Status: AC | PRN

## 2022-08-07 NOTE — Assessment & Plan Note (Signed)
Will give prednisone for symptom(s). Encouraged continuing with benadryl Encouraged continuing with holding chemo

## 2022-08-07 NOTE — Assessment & Plan Note (Signed)
Brbpr reported after prep H.

## 2022-08-07 NOTE — Progress Notes (Signed)
[facility]: [phone]   Routine Medical Office Visit  Patient:  [name] Shelton      Age: 47 y.o.       Sex:  male  Date:   08/07/2022  PCP:    Loralee Pacas, MD   [place] Provider: Loralee Pacas, MD   Problem Focused Charting:   Medical Decision Making per Assessment/Plan   Alexy was seen today for chemo side effects.  Mucositis due to and not concurrent with chemotherapy  Drug eruption Overview: Around 08/03/2021 started Scalp and abdomen Associated with razorblade tenesmus Benadryl helps   Assessment & Plan: Will give prednisone for symptom(s). Encouraged continuing with benadryl Encouraged continuing with holding chemo    Orders: -     predniSONE; Take 2 pills for 3 days, 1 pill for 4 days  Dispense: 10 tablet; Refill: 0  Proctitis -     Lidocaine-Hydrocortisone Ace; Place rectally twice a day for 7 days maximum  Dispense: 14 kit; Refill: 0 -     oxyCODONE-Acetaminophen; Take 1 tablet by mouth every 8 (eight) hours as needed for up to 5 days for pain.  Dispense: 12 tablet; Refill: 0  Internal hemorrhoids Assessment & Plan: Irven [place] reported after prep H.      Patients chart review and interview were used to generate a prompt for artificial intelligence analysis ([employer] artificial intelligence) clinical decision support.  AI output was reviewed and is provided in red:  #Flare of Drug Eruption with Excruciating Anal Pain  The patient is a 47 year old male with a history of widely metastatic leiomyosarcoma, currently experiencing a flare of a drug eruption likely secondary to votrient administration, which is known to cause such reactions. The patient's anal pain is described as excruciating and similar to the sensation of razor blades, which is not typical for hemorrhoids alone, suggesting that the pain may be exacerbated by the drug eruption or another process. The effectiveness of benadryl in alleviating the rash  indicates an allergic or hypersensitivity component to the skin manifestation, while the lack of response to preparation H suggests that the anal pain may not be solely due to hemorrhoidal inflammation. The absence of bowel movements could be related to the pain, medication side effects, or another underlying issue. The differential diagnosis includes drug-induced skin and mucosal reaction, exacerbated hemorrhoidal disease, anal fissure, or less likely, bowel obstruction or infection.  Dx: - Comprehensive metabolic panel (CMP) to assess for any metabolic disturbances. - Complete blood count (CBC) to evaluate for infection or hematologic response to the drug. - Liver function tests (LFTs) to monitor for drug-induced hepatotoxicity. - Renal function tests to monitor for drug-induced nephrotoxicity. - Stool guaiac test to check for occult blood which may indicate gastrointestinal bleeding. - Anoscopy or sigmoidoscopy to directly visualize the anal canal and rectum for pathology. - Skin biopsy of the rash if the diagnosis remains unclear after initial assessments.  Tx: - Discontinue votrient temporarily to assess for improvement of symptoms, under oncologist supervision. - Increase the dose of antihistamines like benadryl or consider a different class such as H2 blockers or corticosteroids for severe itching. - Analgesics for pain management, starting with non-opioid analgesics and escalating as necessary. - Sitz baths to provide symptomatic relief for anal discomfort. - Topical anesthetics to apply to the anal area for pain relief. - Stool softeners to prevent constipation and reduce straining during bowel movements. - Consultation with a gastroenterologist for endoscopic evaluation and a dermatologist for skin eruption management. - Oncology consultation  to discuss alternative cancer therapies if votrient is determined to be the cause of the severe reaction.   He will do fiber and we discussed  sitz baths, and continue holding votrient with onc oversight.  Encouraged continuing with votrient in future despite this if another alternative medication with similar efficacy can't be found to control the leiomyosarcoma.   Gave percocet in case of intractable pain again- he was ready to just quit votrient permanently and die of cancer spread, the pain was so bad in the anus.    Subjective - Clinical Presentation:   [name] Shelton is a 47 y.o. 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[place]) 06/18/2011   CARDIAC MURMUR 09/24/2007   Past Medical History:  Diagnosis Date   Angioedema    residual right thigh/ lower leg  s/p  excision lymph node and radiation therapy 2006/ 2007   Cancer [facility])    Drug eruption 08/07/2022   Around 08/03/2021 started Scalp and abdomen Associated with razorblade tenesmus Benadryl helps    History of sarcoma oncologist-  dr Crisoforo Oxford ([facility])---  no recurrence since 2012   dx 2006  right thigh/ goin  leimyosarcoma (low grade)  s/p  excisional bx right inguinal lymph node(and resection tumor and inguinal lymph node 2007)  and radiation therapy 2007//   2012 recurrent in liver  s/p  resection tumor of liver   Right hydrocele     Outpatient Medications Prior to Visit  Medication Sig   dronabinol (MARINOL) 2.5 MG capsule Take 1 capsule (2.5 mg total) by mouth 2 (two) times daily before a meal.   pazopanib (VOTRIENT) 200 MG tablet Take by mouth.   No facility-administered medications prior to  visit.    Chief Complaint  Patient presents with   Chemo side effects    Rash all over body. Internal hemorrhoids flaring up. WBC count is lower than it has ever been.    HPI   Patient reports razor blade pain internally at anus Patient reports rash spreading all over body Chemo was stopped already for this spreading rash        Objective:  Physical Exam  BP 133/80 (BP Location: Right Arm, Patient Position: Sitting)   Pulse 67   Temp 97.7 F (36.5 C) (Temporal)   Ht '6\' 2"'$  (1.88 m)   Wt 198 lb 3.2 oz (89.9 kg)   SpO2 98%   BMI 25.45 kg/m   Overweight  by BMI criteria but truncal adiposity (waist circumference or caliper) should be used instead. Wt Readings from Last 10 Encounters:  08/07/22 198 lb 3.2 oz (89.9 kg)  08/05/22 198 lb 3.2 oz (89.9 kg)  06/30/15 197 lb (89.4 kg)  02/21/15 199 lb (90.3 kg)  01/22/15 190 lb 3.2 oz (86.3 kg)  07/09/14 196 lb 9.6 oz (89.2 kg)  07/22/13 192 lb 3.2 oz (87.2 kg)  10/16/11 194 lb 8 oz (88.2 kg)   Vital signs reviewed.  Nursing notes reviewed. Weight trend reviewed. General Appearance:  Well developed, well nourished male in no acute distress.   Normal  work of breathing at rest Musculoskeletal: All extremities are intact.  Neurological:  Awake, alert,  No obvious focal neurological deficits or cognitive impairments Psychiatric:  Appropriate mood, pleasant demeanor Problem-specific findings:  rash morbilifform diffuse mainly trunk, uncomfortable appearing       Signed: Loralee Pacas, MD 08/07/2022 1:22 PM

## 2022-09-09 ENCOUNTER — Ambulatory Visit: Payer: Managed Care, Other (non HMO) | Admitting: Nurse Practitioner

## 2022-10-10 ENCOUNTER — Other Ambulatory Visit: Payer: Self-pay | Admitting: Internal Medicine

## 2022-10-10 DIAGNOSIS — K6289 Other specified diseases of anus and rectum: Secondary | ICD-10-CM

## 2022-10-10 DIAGNOSIS — L270 Generalized skin eruption due to drugs and medicaments taken internally: Secondary | ICD-10-CM

## 2022-10-10 MED ORDER — LIDOCAINE-HYDROCORTISONE ACE 3-2.5 % RE KIT: PACK | RECTAL | 0 refills | Status: DC

## 2022-10-10 MED ORDER — PREDNISONE 20 MG PO TABS: ORAL_TABLET | ORAL | 0 refills | Status: DC

## 2022-11-26 ENCOUNTER — Other Ambulatory Visit: Payer: Self-pay | Admitting: Internal Medicine

## 2022-11-26 ENCOUNTER — Encounter: Payer: Self-pay | Admitting: Internal Medicine

## 2022-11-26 DIAGNOSIS — L270 Generalized skin eruption due to drugs and medicaments taken internally: Secondary | ICD-10-CM

## 2022-11-26 NOTE — Telephone Encounter (Signed)
Pt would like to know why the RX was denied. Please advise.

## 2023-04-21 ENCOUNTER — Encounter: Payer: Self-pay | Admitting: Internal Medicine

## 2023-04-21 ENCOUNTER — Ambulatory Visit (INDEPENDENT_AMBULATORY_CARE_PROVIDER_SITE_OTHER): Payer: 59 | Admitting: Internal Medicine

## 2023-04-21 VITALS — BP 122/85 | HR 88 | Temp 98.3°F | Ht 74.0 in | Wt 196.8 lb

## 2023-04-21 DIAGNOSIS — Z0001 Encounter for general adult medical examination with abnormal findings: Secondary | ICD-10-CM

## 2023-04-21 DIAGNOSIS — K648 Other hemorrhoids: Secondary | ICD-10-CM | POA: Diagnosis not present

## 2023-04-21 DIAGNOSIS — C499 Malignant neoplasm of connective and soft tissue, unspecified: Secondary | ICD-10-CM

## 2023-04-21 DIAGNOSIS — M7989 Other specified soft tissue disorders: Secondary | ICD-10-CM

## 2023-04-21 DIAGNOSIS — K6289 Other specified diseases of anus and rectum: Secondary | ICD-10-CM | POA: Diagnosis not present

## 2023-04-21 DIAGNOSIS — C7951 Secondary malignant neoplasm of bone: Secondary | ICD-10-CM

## 2023-04-21 DIAGNOSIS — Z1211 Encounter for screening for malignant neoplasm of colon: Secondary | ICD-10-CM

## 2023-04-21 DIAGNOSIS — T451X5A Adverse effect of antineoplastic and immunosuppressive drugs, initial encounter: Secondary | ICD-10-CM

## 2023-04-21 DIAGNOSIS — R112 Nausea with vomiting, unspecified: Secondary | ICD-10-CM

## 2023-04-21 DIAGNOSIS — Z0000 Encounter for general adult medical examination without abnormal findings: Secondary | ICD-10-CM

## 2023-04-21 MED ORDER — LIDOCAINE-HYDROCORTISONE ACE 3-2.5 % RE KIT: PACK | RECTAL | 0 refills | Status: DC

## 2023-04-21 NOTE — Progress Notes (Signed)
[facility]: [phone]   -- Annual Preventive Medical Office Visit --  Patient:  [name] Shelton      Age: 47 y.o.       Sex:  male  Date:   04/21/2023 Patient Care Team: Lula Olszewski, MD as PCP - General (Internal Medicine) Orvilla Fus, MD as Referring Physician (Internal Medicine) Today's Healthcare Provider: Lula Olszewski, MD   Chief Complaint  Patient presents with   Annual Exam    Purpose of Visit: Comprehensive preventive health assessment and personalized health maintenance planning.  This encounter was conducted as a Comprehensive Physical Exam (CPE) preventive care annual visit. The patient's medical history and problem list were reviewed to inform individualized preventive care recommendations. No problem-specific medical treatment was provided during this visit.    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 He feels better since stopping chemotherapy. We await imaging in 8 weeks to evaluate the radiation therapy's effectiveness. A lesion on the right arm was excised previously, and recent imaging revealed a new lesion, though it's uncertain if this is scar tissue or new growth. We will continue the current radiation therapy plan and schedule imaging in November/December to assess the possible new growth in the right arm. Internal hemorrhoids Hemorrhoids He reports a history of hemorrhoids, exacerbated by recent changes in bowel habits due to chemotherapy, but no current bleeding. We will prescribe Lidocaine Hydrocortisone cream for symptomatic relief. 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 He has not had an eye exam recently and has not undergone a recent colonoscopy. His weight, which fluctuated due to chemotherapy, is now stable. Blood pressure is borderline but not a major concern. We recommend retinal imaging during the next eye exam, checking the expiration date on the Cologuard test kit and using it if valid or sending a new kit otherwise. We encourage a heart-healthy diet including avocados, extra virgin olive oil, nuts, fruits, and vegetables, along with regular physical activity. We advise continuing regular dental check-ups and considering sleep apnea screening if symptoms of daytime fatigue persist outside of treatment periods. We will continue monitoring weight and blood pressure as currently managed and ensure he is up-to-date on all vaccinations.   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  He reports his last eye exam was:  been a while  Dental health:  He reports he has been keeping up with his dental visits.  He intends to do so in the future.  We encourage regular tooth brushing/flossing. Sinus health:  We encouraged  sterile saline nasal misting sinus rinses daily for pollen, to reduce allergies and risk for sinus infections.    Sterile can based misting products are recommended due to superior misting and ease of maintaining sterility Sleep Apnea screening:  We encourage self monitoring of sleep quality with SnoreLab App and other tools/apps that are now available at retail STOP-Bang Score (scored NO unless checked) []  Do you snore loudly only with alcohol. []  Tired, fatigued, or sleepy during the daytime only during radiation therapy []  Witness apneas []  Significant hypertension []  BMI greater than 35    []  Age older than 48 years old []  Has large neck size over 15.7 in [x]  Male Total Score: 1  reports wife is not concerned about this. Cardiovascular Risk Factor Reduction:   Advised patient of need for regular exercise and diet rich and fruits and vegetables and healthy fats to reduce risk of heart attack and stroke.  Avoid first- and second-hand smoke and stimulants.   Avoid extreme exercise- exercise in moderation (150 minutes per week is a good goal) Wt Readings from Last 3 Encounters:  04/21/23 196 lb 12.8 oz (89.3 kg)  08/07/22 198 lb 3.2 oz (89.9 kg)  08/05/22 198 lb 3.2 oz (89.9 kg)  Body mass index is 25.27 kg/m. He reports his diet consists of going to eat biscuits and gravy He reports his exercise includes of working in yard Health maintenance and immunizations reviewed and he was encouraged to complete anything that is due:  There is no immunization history on file for this patient. Health Maintenance Due  Topic Date Due   DTaP/Tdap/Td (1 - Tdap) Never done   Colonoscopy  Never done    Sexual transmitted infection screening: testing offered today, but patient declined as he feels he is low risk based on his sexual history.   Social History   Substance and Sexual Activity  Sexual Activity Yes     Substance use:  I discussed that my recommendation is total abstinence from all substances of abuse including smoke and 2nd hand smoke, alcohol, illicit drugs, smoking, inhalants, sugar.   Offered to  assist with any use disorders or addictions.   Injury prevention: Discussed safety belts, safety helmets, smoke detectors. Cancer Screening: Penile/Testicle/Scrotum cancer screening: Asked the patient about genital warts or tumors/abnormalities of penis/testicles/scrotum, encouraged patient to to inform me of any. Patient reports there are none. Thyroid cancer screening: patient advised to check by palpating thyroid for nodules I didn't feel any today  Prostate cancer screening:  Denies family history of prostate cancer or hematospermia so too young for screening by current guidelines.(No results found for: "PSA")skips blood work  Colon cancer screening:   will complete Cologuard previously sent  Lung cancer screening:  Current guidelines recommend Individuals aged 88 to 49 who currently smoke or formerly smoked and have a >= 20 pack-year smoking history should undergo annual screening with low-dose computed tomography (LDCT). Skin cancer screening:  Advised regular sunscreen use. He denies worrisome, changing, or new skin lesions. Showed him pictures of melanomas for reference:  Return to care in 1 year for next preventative visit.   Subjective  AI-Extracted: Discussed the use of AI scribe software for clinical note transcription with the patient, who gave verbal consent to proceed.  History of Present Illness   The patient, with a history of sarcoma, presents with ongoing management of his condition. He underwent three resections, but a liver resection was deemed too dangerous. Instead, he underwent radiation therapy. The patient reports  that chemotherapy has not been very effective, but radiation has shown some promise, although the full effects are yet to be determined due to the need to wait eight weeks post-treatment for imaging.  Over the past eight months, the patient has experienced significant weight fluctuations and changes in hair color due to chemotherapy. He reports feeling  significantly better and more energetic after stopping chemotherapy, describing it as an adrenaline-like feeling.  The patient's sarcoma was initially located in his arm and was surgically removed. Currently, the only known active disease sites are three spots in the liver.  He also reports right shoulder pain following a fall, which is being investigated for a possible rotator cuff injury.  The patient has been maintaining an active lifestyle, including walking and yard work, despite his treatment. He reports no significant issues with fatigue or drowsiness during the daytime, except during periods of active treatment. He has a history of smoking but quit several years ago.  The patient also reports a recent issue with hemorrhoids or an anal fissure, which has been managed with laxatives and topical treatments. He requests a refill of his lidocaine hydrocortisone prescription for this issue.  Overall, the patient's condition appears to be stable, with ongoing management of symptoms and regular monitoring of his sarcoma. He expresses a desire to maintain a normal lifestyle and not be a "full-time cancer patient."      Review of Systems  Constitutional:  Positive for malaise/fatigue. Negative for chills, diaphoresis, fever and weight loss.  HENT:  Negative for congestion, ear discharge, ear pain, hearing loss, nosebleeds, sinus pain, sore throat and tinnitus.   Eyes:  Negative for blurred vision, double vision, photophobia, pain, discharge and redness.  Respiratory:  Negative for cough, hemoptysis, sputum production, shortness of breath, wheezing and stridor.   Cardiovascular:  Negative for chest pain, palpitations, orthopnea, claudication, leg swelling and PND.  Gastrointestinal:  Positive for diarrhea. Negative for abdominal pain, blood in stool, constipation, heartburn, melena, nausea and vomiting.  Genitourinary:  Negative for dysuria, flank pain, frequency, hematuria and urgency.   Musculoskeletal:  Negative for back pain, falls, joint pain, myalgias and neck pain.  Skin:  Negative for itching and rash.  Neurological:  Negative for dizziness, tingling, tremors, sensory change, speech change, focal weakness, seizures, loss of consciousness, weakness and headaches.  Endo/Heme/Allergies:  Negative for environmental allergies and polydipsia. Does not bruise/bleed easily.  Psychiatric/Behavioral:  Negative for depression, hallucinations, memory loss, substance abuse and suicidal ideas. The patient is not nervous/anxious and does not have insomnia.      Disclaimer about ROS at Annual Preventive Visits Patients are informed before the Review of Systems (ROS) that identifying significant medical issues during the wellness visit may require immediate attention, potentially resulting in a separate billable encounter beyond the scope of the preventive exam. This disclosure is mandated by professional ethics and legal obligations, as healthcare providers must address any substantial health concerns raised during any patient interaction. A comprehensive ROS is required by insurance companies for billing the visit. However, this structure may inadvertently discourage patients from fully disclosing health concerns due to potential financial implications. Consequently, patients often emphasize that any positive ROS findings are related to stable chronic conditions, requesting that these not be discussed during the preventive visit to avoid additional charges. Patients may also ask that reported complaints not be listed in the ROS to prevent affecting billing.  Problem list overviews that were updated at today's visit: Problem  Internal Hemorrhoids  Leiomyosarcoma (Hcc)  Diagnosed in artery in right upper leg after brought to attention with swollen lymph node was responding to antibiotic(s) but stopped so was biopsied.  Had adjuvant radiation and stayed in remission in 5 years, then distant  met recurrence 2012 in liver , 30 % liver removed,  returned in liver 2020 and they took 25% more, and again in 2023 there was another resection, and there is plan  Pain in R arm may 2023- mri found metastases to R arm humeral bone marrow on liver resection at that time. Bone scraped and radiated. And chemo started about 07/16/2022 the residual liver tumors will be used to assess response to chemo.      I attest that I have reviewed and confirmed the patients current medications to meet the medication reconciliation requirement  Current Outpatient Medications on File Prior to Visit  Medication Sig   hydrocortisone (ANUSOL-HC) 2.5 % rectal cream Place rectally 2 (two) times daily.   ondansetron (ZOFRAN) 4 MG tablet Take by mouth.   No current facility-administered medications on file prior to visit.   Medications Discontinued During This Encounter  Medication Reason   predniSONE (DELTASONE) 20 MG tablet Completed Course   dronabinol (MARINOL) 2.5 MG capsule Completed Course   Lidocaine-Hydrocortisone Ace 3-2.5 % KIT Reorder   The following were reviewed and entered/updated into our electronic MEDICAL RECORD NUMBER   04/21/2023    8:24 AM  Depression screen PHQ 2/9  Decreased Interest 0  Down, Depressed, Hopeless 0  PHQ - 2 Score 0   Past Medical History:  Diagnosis Date   Angioedema    residual right thigh/ lower leg  s/p  excision lymph node and radiation therapy 2006/ 2007   Cancer [place])    Drug eruption 08/07/2022   Around 08/03/2021 started Scalp and abdomen Associated with razorblade tenesmus Benadryl helps    History of sarcoma oncologist-  dr Flonnie Hailstone ([facility])---  no recurrence since 2012   dx 2006  right thigh/ goin  leimyosarcoma (low grade)  s/p  excisional bx right inguinal lymph node(and resection tumor and inguinal lymph node 2007)  and radiation therapy 2007//   2012 recurrent in liver  s/p  resection tumor of liver   Right hydrocele    Patient Active Problem List    Diagnosis Date Noted   Drug eruption 08/07/2022   Leg swelling 08/05/2022   Chemotherapy induced nausea and vomiting 08/05/2022   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   Lesion of right humerus 11/07/2021   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   Cervical radiculopathy 09/03/2021   Non-recurrent unilateral inguinal hernia without obstruction or gangrene 12/14/2019   Internal hemorrhoids 10/16/2011   Secondary malignant neoplasm of liver (HCC) 06/18/2011   Leiomyosarcoma (HCC) 06/18/2011   CARDIAC MURMUR 09/24/2007   Past Surgical History:  Procedure Laterality Date   EXCISIONAL BX RIGHT INGUINAL LYMPH NODE  06/19/2005   low grade leimyosarcoma   HYDROCELE EXCISION Right 06/30/2015   Procedure: HYDROCELECTOMY ADULT;  Surgeon: Crist Fat, MD;  Location[facility];  Service: Urology;  Laterality: Right;   Intralesional curretage of tumor in right humerus  02/2022   RESECTION LIVER MASS  07/15/2010   recurrent sarcoma   RESECTION RIGHT INGUINAL LYMPH NODE/ TUMOR  Jan 2012    [facility]   Family History  Adopted: Yes   Allergies  Allergen Reactions   Wound Dressing Adhesive Rash    Paper tape ok to use   Social History   Tobacco Use  Smoking status: Former    Current packs/day: 0.00    Average packs/day: 0.5 packs/day for 10.0 years (5.0 ttl pk-yrs)    Types: Cigarettes    Start date: 07/15/1994    Quit date: 07/15/2004    Years since quitting: 18.7   Smokeless tobacco: Never  Vaping Use   Vaping status: Never Used  Substance Use Topics   Alcohol use: Not Currently    Alcohol/week: 6.0 - 12.0 standard drinks of alcohol    Types: 6 - 12 Cans of beer per week   Drug use: Not Currently    Types: Marijuana     Objective  BP 122/85   Pulse 88   Temp 98.3 F (36.8 C)   Ht 6\' 2"  (1.88 m)   Wt 196 lb 12.8 oz (89.3 kg)   SpO2 99%   BMI 25.27 kg/m   Body mass index is 25.27 kg/m. Wt Readings from Last 3 Encounters:  04/21/23 196 lb 12.8 oz  (89.3 kg)  08/07/22 198 lb 3.2 oz (89.9 kg)  08/05/22 198 lb 3.2 oz (89.9 kg)   Wt Readings from Last 10 Encounters:  04/21/23 196 lb 12.8 oz (89.3 kg)  08/07/22 198 lb 3.2 oz (89.9 kg)  08/05/22 198 lb 3.2 oz (89.9 kg)  06/30/15 197 lb (89.4 kg)  02/21/15 199 lb (90.3 kg)  01/22/15 190 lb 3.2 oz (86.3 kg)  07/09/14 196 lb 9.6 oz (89.2 kg)  07/22/13 192 lb 3.2 oz (87.2 kg)  10/16/11 194 lb 8 oz (88.2 kg)     Physical Exam   VITALS: BP borderline MEASUREMENTS: WT 196-198 NECK: No lymphadenopathy palpated CHEST: Lung auscultation clear CARDIOVASCULAR: Heart sounds not as loud as expected for chest size ABDOMEN: Liver palpation deferred due to patient report of pain GENITOURINARY: No abnormalities noted on testicular examination SKIN: No abnormalities noted on inspection     Nonverbal version: GEN: NAD, Resting Comfortably. HEENT: Tympanic membranes normal appearing bilaterally, Oropharynx clear, No Thyromegaly noted. No palpable lymphadenopathy or thyroid nodules. CARDIOVASCULAR: S1 and S2 heart sounds have regular rate and rhythm with no murmurs appreciated but sounds quiet and previously heard murmur PULMONARY:  Normal work of breathing. Clear to auscultation bilaterally with no crackles, wheezes, or rhonchi. ABDOMEN: Soft, Nontender, Nondistended. Cannot palpate liver but no tumors felt in abdomen palpation MSK: No edema, cyanosis, or clubbing noted. SKIN: Warm, dry, no lesions of concern observed. NEURO: CN2-12 grossly intact. Strength 5/5 in upper and lower extremities. Reflexes symmetric and intact bilaterally. Right leg larger than left PSYCH: Normal affect and thought content, pleasant and cooperative.

## 2023-04-21 NOTE — Patient Instructions (Signed)
VISIT SUMMARY:  During your visit, we discussed your ongoing management of sarcoma, which has shown some promise with radiation therapy. We also addressed your right shoulder pain, which may be due to a rotator cuff injury, and your issue with hemorrhoids. You've been maintaining an active lifestyle and have expressed a desire to continue doing so. Your overall condition appears stable, and we will continue to monitor your symptoms and the progress of your sarcoma.  YOUR PLAN:  -METASTATIC SARCOMA: We discussed this but defer management to your oncology team. We didn't find any areas of lymph node or swelling on your Comprehensive Physical Exam (CPE) preventive care annual visit.  -HEMORRHOIDS: These are swollen veins in your lower rectum and anus. We will prescribe Lidocaine Hydrocortisone cream to help with your symptoms.  This also treats rectal inflammation from chemotherapy.  -GENERAL HEALTH MAINTENANCE: This involves taking care of your overall health. We recommend a heart-healthy diet, regular physical activity, regular dental check-ups, and considering sleep apnea screening if symptoms of daytime fatigue persist outside of treatment periods. We will also continue monitoring your weight and blood pressure.  We encourage you to get back to exercising and heart healthy diet now that you're done with chemotherapy/radiotherapy  INSTRUCTIONS:  Please ensure you have an eye exam with retinal imaging and use the Cologuard test kit for colon cancer screening if it's still valid. If not, we will send a new kit. Continue with your heart-healthy diet and regular physical activity. Use the prescribed Lidocaine Hydrocortisone cream for your hemorrhoids as directed. We will schedule your next imaging in November/December to assess the possible new growth in your right arm.

## 2023-04-21 NOTE — Assessment & Plan Note (Signed)
Hemorrhoids He reports a history of hemorrhoids, exacerbated by recent changes in bowel habits due to chemotherapy, but no current bleeding. We will prescribe Lidocaine Hydrocortisone cream for symptomatic relief.

## 2023-04-21 NOTE — Assessment & Plan Note (Signed)
He just completed radiation therapy for liver metastases after completing chemotherapy, which caused significant side effects including weight fluctuations and hair color changes. He feels better since stopping chemotherapy. We await imaging in 8 weeks to evaluate the radiation therapy's effectiveness. A lesion on the right arm was excised previously, and recent imaging revealed a new lesion, though it's uncertain if this is scar tissue or new growth. We will continue the current radiation therapy plan and schedule imaging in November/December to assess the possible new growth in the right arm.

## 2023-09-13 HISTORY — PX: OTHER SURGICAL HISTORY: SHX169

## 2023-10-10 ENCOUNTER — Ambulatory Visit
Admission: RE | Admit: 2023-10-10 | Discharge: 2023-10-10 | Disposition: A | Discharge status: Home / Self Care | Source: Ambulatory Visit | Attending: Family Medicine | Admitting: Family Medicine

## 2023-10-10 ENCOUNTER — Other Ambulatory Visit: Payer: Self-pay

## 2023-10-10 VITALS — BP 137/90 | HR 66 | Temp 97.8°F | Resp 18

## 2023-10-10 DIAGNOSIS — L03811 Cellulitis of head [any part, except face]: Secondary | ICD-10-CM

## 2023-10-10 MED ORDER — CEPHALEXIN 500 MG PO CAPS: 500.0000 mg | ORAL_CAPSULE | Freq: Two times a day (BID) | ORAL | 0 refills | Status: AC

## 2023-10-10 NOTE — Discharge Instructions (Signed)
 Take cephalexin 500 mg--1 capsule 2 times daily for 7 days  Warm compresses can help  Follow-up with your primary care and/or surgeon about this issue.

## 2023-10-10 NOTE — ED Triage Notes (Signed)
 Pt reports painful bump on the back of his head "for a while but larger over the last 5 days". States he tried to squeeze it but wasn't able to get it to drain. Recent surgery on right arm for sarcoma. States he is no longer needing narcotic pain medication- taking tylenol only

## 2023-10-10 NOTE — ED Provider Notes (Signed)
 [facility]    CSN: [id number] Arrival date & time: 10/10/23  1253      History   Chief Complaint Chief Complaint  Patient presents with   Abscess    Entered by patient    HPI [name] Shelton is a 48 y.o. male.    Abscess Here for swelling on his right posterior scalp.  He has had a's small bump on the back of his head for quite a while.  In the last week he noticed it being more irritated and painful and a little bigger.  He did try to squeeze it and nothing happened but it did get more irritated and larger after that.  He tried that a couple of times and today since he is not fiddle with it it actually is feeling a little better.  It is still tender and a little larger than usual.  No fever or chills  No allergies to medications   Past medical history significant for leiomyosarcoma. Past Medical History:  Diagnosis Date   Angioedema    residual right thigh/ lower leg  s/p  excision lymph node and radiation therapy 2006/ 2007   Cancer [facility])    Drug eruption 08/07/2022   Around 08/03/2021 started Scalp and abdomen Associated with razorblade tenesmus Benadryl helps    History of sarcoma oncologist-  dr Flonnie Hailstone ([facility])---  no recurrence since 2012   dx 2006  right thigh/ goin  leimyosarcoma (low grade)  s/p  excisional bx right inguinal lymph node(and resection tumor and inguinal lymph node 2007)  and radiation therapy 2007//   2012 recurrent in liver  s/p  resection tumor of liver   Right hydrocele     Patient Active Problem List   Diagnosis Date Noted   Drug eruption 08/07/2022   Leg swelling 08/05/2022   Chemotherapy induced nausea and vomiting 08/05/2022   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   Lesion of right humerus 11/07/2021   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   Cervical radiculopathy 09/03/2021   Non-recurrent unilateral inguinal hernia without obstruction or gangrene 12/14/2019   Internal hemorrhoids 10/16/2011   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   Leiomyosarcoma (HCC) 06/18/2011   CARDIAC MURMUR 09/24/2007    Past Surgical History:  Procedure Laterality Date   EXCISIONAL BX RIGHT INGUINAL LYMPH NODE  06/19/2005   low grade leimyosarcoma   HYDROCELE EXCISION Right 06/30/2015   Procedure: HYDROCELECTOMY ADULT;  Surgeon: Crist Fat, MD;  Location: [facility];  Service: Urology;  Laterality: Right;   Intralesional curretage of tumor in right humerus  02/2022   RESECTION LIVER MASS  07/15/2010   recurrent sarcoma   RESECTION RIGHT INGUINAL LYMPH NODE/ TUMOR  Jan 2012    [facility] Medications    Prior to Admission medications   Medication Sig Start Date End Date Taking? Authorizing Provider  cephALEXin (KEFLEX) 500 MG capsule Take 1 capsule (500 mg total) by mouth 2 (two) times daily for 7 days. 10/10/23 10/17/23 Yes Vivien Barretto, Janace Aris, MD  hydrocortisone (ANUSOL-HC) 2.5 % rectal cream Place rectally 2 (two) times daily. 11/26/22   [provider]  Lidocaine-Hydrocortisone Ace 3-2.5 % KIT Place rectally twice a day for 7 days maximum 04/21/23   Lula Olszewski, MD    Family History Family History  Adopted: Yes    Social History Social History   Tobacco Use   Smoking status: Former    Current packs/day: 0.00  Average packs/day: 0.5 packs/day for 10.0 years (5.0 ttl pk-yrs)    Types: Cigarettes    Start date: 07/15/1994    Quit date: 07/15/2004    Years since quitting: 19.2   Smokeless tobacco: Never  Vaping Use   Vaping status: Never Used  Substance Use Topics   Alcohol use: Not Currently    Alcohol/week: 6.0 - 12.0 standard drinks of alcohol    Types: 6 - 12 Cans of beer per week   Drug use: Not Currently    Types: Marijuana     Allergies   Wound dressing adhesive   Review of Systems Review of Systems   Physical Exam Triage Vital Signs ED Triage Vitals  Encounter Vitals Group     BP 10/10/23 1333 (!) 137/90     Systolic BP  Percentile --      Diastolic BP Percentile --      Pulse Rate 10/10/23 1333 66     Resp 10/10/23 1333 18     Temp 10/10/23 1333 97.8 F (36.6 C)     Temp Source 10/10/23 1333 Oral     SpO2 10/10/23 1333 96 %     Weight --      Height --      Head Circumference --      Peak Flow --      Pain Score 10/10/23 1330 2     Pain Loc --      Pain Education --      Exclude from Growth Chart --    No data found.  Updated Vital Signs BP (!) 137/90 (BP Location: Left Arm)   Pulse 66   Temp 97.8 F (36.6 C) (Oral)   Resp 18   SpO2 96%   Visual Acuity Right Eye Distance:   Left Eye Distance:   Bilateral Distance:    Right Eye Near:   Left Eye Near:    Bilateral Near:     Physical Exam Vitals reviewed.  Constitutional:      General: He is not in acute distress.    Appearance: He is not ill-appearing, toxic-appearing or diaphoretic.  HENT:     Head:     Comments: In the right occipital area there is an area of erythema and induration and mild tenderness about 2 cm in diameter.  There is no fluctuant area. Skin:    Coloration: Skin is not pale.  Neurological:     Mental Status: He is alert and oriented to person, place, and time.  Psychiatric:        Behavior: Behavior normal.      UC Treatments / Results  Labs (all labs ordered are listed, but only abnormal results are displayed) Labs Reviewed - No data to display  EKG   Radiology No results found.  Procedures Procedures (including critical care time)  Medications Ordered in UC Medications - No data to display  Initial Impression / Assessment and Plan / UC Course  I have reviewed the triage vital signs and the nursing notes.  Pertinent labs & imaging results that were available during my care of the patient were reviewed by me and considered in my medical decision making (see chart for details).     Keflex is sent in for the cellulitis.  It is not an abscess at this time, and so does not need an I&D  procedure.  I have asked him not to squeeze such areas in the future. Final Clinical Impressions(s) / UC Diagnoses   Final  diagnoses:  Cellulitis of scalp     Discharge Instructions      Take cephalexin 500 mg--1 capsule 2 times daily for 7 days  Warm compresses can help  Follow-up with your primary care and/or surgeon about this issue.      ED Prescriptions     Medication Sig Dispense Auth. Provider   cephALEXin (KEFLEX) 500 MG capsule Take 1 capsule (500 mg total) by mouth 2 (two) times daily for 7 days. 14 capsule Marlinda Mike, Janace Aris, MD      PDMP not reviewed this encounter.   Zenia Resides, MD 10/10/23 1351

## 2023-10-23 ENCOUNTER — Ambulatory Visit (INDEPENDENT_AMBULATORY_CARE_PROVIDER_SITE_OTHER): Admitting: Gastroenterology

## 2023-10-23 ENCOUNTER — Encounter: Payer: Self-pay | Admitting: Gastroenterology

## 2023-10-23 ENCOUNTER — Ambulatory Visit: Admitting: Physician Assistant

## 2023-10-23 VITALS — BP 120/78 | HR 70 | Ht 73.0 in | Wt 208.0 lb

## 2023-10-23 DIAGNOSIS — K6289 Other specified diseases of anus and rectum: Secondary | ICD-10-CM | POA: Diagnosis not present

## 2023-10-23 DIAGNOSIS — R194 Change in bowel habit: Secondary | ICD-10-CM | POA: Diagnosis not present

## 2023-10-23 DIAGNOSIS — K602 Anal fissure, unspecified: Secondary | ICD-10-CM

## 2023-10-23 MED ORDER — AMBULATORY NON FORMULARY MEDICATION: 1 refills | Status: DC

## 2023-10-23 NOTE — Progress Notes (Signed)
 Chief Complaint: Thrombosed hemorrhoid? Primary GI Doctor: (previously Dr. Arlyce Dice) Dr. Barron Alvine  HPI:  Patient is a 48 year old male patient with medical history of metastatic leiomyosarcoma first diagnosed in 2007 and hemorrhoids, who presents today for a complaint of suspected thrombosed hemorrhoid.    10/16/2011 patient last seen in GI office by Dr. Arlyce Dice for evaluation of hemorrhoids.  At that time patient treated with suppositories and consider banding if symptoms recur or become refractory to medical therapy.  On 10/10/2023 patient seen in ED for swelling on his right posterior scalp.  Patient given antibiotics and recommendations to follow-up with PCP and/or surgeon.  Pt being seen by [facility] for Metastatic Leiomyosarcoma w/ Right femur (liver, right humerus).Patient was found to have liver metastases in 2012 and underwent partial hepatectomy. Found to have right humerus lesion in September 2023, which was biopsied in [place] 2023 then resected and cemented in September 2023 then 5 fractions of radiation to the area. No radiation side effects at the site. Since then, MRI in July 2024 revealed progressed soft tissue enhancement. December 2024 was seen by orthopedics again who recommended likely conservative management vs humerus plating.   Interval History  Patient presents with main complaint of rectal pain after having no bowel movement for 3 days. He states it felt like he had been stung by yellow jacket. He states he feels something on the outside of his rectum he thinks Danylle Ouk be a hemorrhoid. He has had issues in the past with hemorrhoids, but been several years. He currently uses stool softeners as needed for passing hard stools which works well. Patient has never had colonoscopy, would like to schedule today.     Patient's adopted, no known family history.  Wt Readings from Last 3 Encounters:  10/23/23 208 lb (94.3 kg)  04/21/23 196 lb 12.8 oz (89.3 kg)  08/07/22 198 lb  3.2 oz (89.9 kg)    Past Medical History:  Diagnosis Date   Angioedema    residual right thigh/ lower leg  s/p  excision lymph node and radiation therapy 2006/ 2007   Cancer [facility])    Drug eruption 08/07/2022   Around 08/03/2021 started Scalp and abdomen Associated with razorblade tenesmus Benadryl helps    History of sarcoma oncologist-  dr Flonnie Hailstone ([facility])---  no recurrence since 2012   dx 2006  right thigh/ goin  leimyosarcoma (low grade)  s/p  excisional bx right inguinal lymph node(and resection tumor and inguinal lymph node 2007)  and radiation therapy 2007//   2012 recurrent in liver  s/p  resection tumor of liver   Right hydrocele     Past Surgical History:  Procedure Laterality Date   custom Humerus reconstruction Right 09/2023   done at [facility]   EXCISIONAL BX RIGHT INGUINAL LYMPH NODE  06/19/2005   low grade leimyosarcoma   HYDROCELE EXCISION Right 06/30/2015   Procedure: HYDROCELECTOMY ADULT;  Surgeon: Crist Fat, MD;  Location: [facility];  Service: Urology;  Laterality: Right;   Intralesional curretage of tumor in right humerus  02/2022   RESECTION LIVER MASS  07/15/2010   recurrent sarcoma   RESECTION RIGHT INGUINAL LYMPH NODE/ TUMOR  Jan 2012    [facility]    Current Outpatient Medications  Medication Sig Dispense Refill   AMBULATORY NON FORMULARY MEDICATION Medication Name: Diltiazem 2% and Lidocaine 5% Apply a pea size amount to the rectum three times a day for 6 weeks 30 g 1   hydrocortisone (ANUSOL-HC) 2.5 %  rectal cream Place rectally 2 (two) times daily.     Lidocaine-Hydrocortisone Ace 3-2.5 % KIT Place rectally twice a day for 7 days maximum 14 kit 0   No current facility-administered medications for this visit.    Allergies as of 10/23/2023 - Review Complete 10/23/2023  Allergen Reaction Noted   Wound dressing adhesive Rash 02/16/2019    Family History  Adopted: Yes    Review of Systems:    Constitutional: No weight loss,  fever, chills, weakness or fatigue HEENT: Eyes: No change in vision               Ears, Nose, Throat:  No change in hearing or congestion Skin: No rash or itching Cardiovascular: No chest pain, chest pressure or palpitations   Respiratory: No SOB or cough Gastrointestinal: See HPI and otherwise negative Genitourinary: No dysuria or change in urinary frequency Neurological: No headache, dizziness or syncope Musculoskeletal: No new muscle or joint pain Hematologic: No bleeding or bruising Psychiatric: No history of depression or anxiety    Physical Exam:  Vital signs: BP 120/78   Pulse 70   Ht 6\' 1"  (1.854 m)   Wt 208 lb (94.3 kg)   SpO2 96%   BMI 27.44 kg/m   Constitutional:   Pleasant male appears to be in NAD, Well developed, Well nourished, alert and cooperative Throat: Oral cavity and pharynx without inflammation, swelling or lesion.  Respiratory: Respirations even and unlabored. Lungs clear to auscultation bilaterally.   No wheezes, crackles, or rhonchi.  Cardiovascular: Normal S1, S2. Regular rate and rhythm. No peripheral edema, cyanosis or pallor.  Gastrointestinal:  Soft, nondistended, nontender. No rebound or guarding. Normal bowel sounds. No appreciable masses or hepatomegaly. Rectal: DRE preformed with tenderness noted. No external hemorrhoids noted. Posterior anal fissure noted.  Msk:  Symmetrical without gross deformities. Without edema, no deformity or joint abnormality.  Neurologic:  Alert and  oriented x4;  grossly normal neurologically.  Skin:   Dry and intact without significant lesions or rashes. Psychiatric: Oriented to person, place and time. Demonstrates good judgement and reason without abnormal affect or behaviors.  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  Impression:  1. Hepatic metastatic disease is a mix of slightly improved and similar to  prior CT, though more definitively improved from November MRI.  2.  No new sites of disease identified.   Assessment: Encounter Diagnoses  Name Primary?   Anal fissure Yes   Rectal pain    Altered bowel habits        48 year old male patient that presents with altered bowel habits and rectal pain.  Upon examination patient had posterior anal fissure.  Will send topical diltiazem with lidocaine to apply TID for next several weeks along with using sitz baths and stool softeners. Will reevaluate in 6 weeks and schedule colonoscopy.    Plan: -follow-up with me in 6 weeks -schedule colonoscopy at follow-up with Dr. Barron Alvine   Thank you for the courtesy of this consult. Please call me with any questions or concerns.   Rawlins Stuard, FNP-C [facility] 10/23/2023, 12:54 PM  Cc: Lula Olszewski, MD

## 2023-10-23 NOTE — Patient Instructions (Signed)
 Your provider has prescribed Diltiazem/Lidocaine  gel for you. Please follow the directions written on your prescription bottle or given to you specifically by your provider. Since this is a specialty medication and is not readily available at most local pharmacies, we have sent your prescription to:  [facility] information is below: Address: [address] [place], [place] [address]  [phone]  *Please DO NOT go directly from our office to pick up this medication! Give the pharmacy 1 day to process the prescription as this is compounded and takes time to make.  Use stool softeners to prevent hard stools.    I appreciate the opportunity to care for you. Deanna May, NP

## 2023-10-24 ENCOUNTER — Other Ambulatory Visit: Payer: Self-pay

## 2023-10-24 ENCOUNTER — Telehealth: Payer: Self-pay | Admitting: Gastroenterology

## 2023-10-24 ENCOUNTER — Other Ambulatory Visit: Payer: Self-pay | Admitting: *Deleted

## 2023-10-24 ENCOUNTER — Other Ambulatory Visit: Payer: Self-pay | Admitting: Gastroenterology

## 2023-10-24 DIAGNOSIS — K6289 Other specified diseases of anus and rectum: Secondary | ICD-10-CM

## 2023-10-24 DIAGNOSIS — C7989 Secondary malignant neoplasm of other specified sites: Secondary | ICD-10-CM

## 2023-10-24 DIAGNOSIS — K602 Anal fissure, unspecified: Secondary | ICD-10-CM

## 2023-10-24 MED ORDER — AMBULATORY NON FORMULARY MEDICATION: 0 refills | Status: DC

## 2023-10-24 NOTE — Telephone Encounter (Signed)
 Inbound call from [facility] with Radiology stating he had received an order for MRI  pelvis enterography. He states he needs to change the order to MRI pelvis and abdomen. He wanted to inform us he will change it and it will be sent back over to [facility] for her to co sign for the order to be changed.

## 2023-10-24 NOTE — Telephone Encounter (Signed)
 [name] Shelton in EXTREME pain the past 7 hours after applying the Diltiazem/Lidocaine oint. Within 5 minutes of application it was stinging and hurting. Please call and advise him.

## 2023-10-24 NOTE — Telephone Encounter (Signed)
 Requesting a call back to discuss medication prescribed, please advise.

## 2023-10-24 NOTE — Progress Notes (Signed)
 Spoke with patients wife, states her husband is laying down not feeling well. His wife states he applied the topical ointment today to rectum and made his rectal pain worse.   Discussed with Dr. Barron Alvine and he would like to send for expedited MRI pelvis, stop diltiazem, and send Rx for topical NTG 0.125% twice daily. Gave these instructions to his wife.  Angelique Blonder will set up MRI.

## 2023-10-24 NOTE — Progress Notes (Signed)
 Agree with the assessment and plan as outlined by [facility], FNP-C.  [name] Bottino, DO, [facility]

## 2023-10-25 ENCOUNTER — Ambulatory Visit (HOSPITAL_COMMUNITY)
Admission: RE | Admit: 2023-10-25 | Discharge: 2023-10-25 | Disposition: A | Discharge status: Home / Self Care | Source: Ambulatory Visit | Attending: Gastroenterology | Admitting: Gastroenterology

## 2023-10-25 ENCOUNTER — Other Ambulatory Visit (HOSPITAL_COMMUNITY)

## 2023-10-25 ENCOUNTER — Ambulatory Visit (HOSPITAL_COMMUNITY)

## 2023-10-25 DIAGNOSIS — C7989 Secondary malignant neoplasm of other specified sites: Secondary | ICD-10-CM | POA: Diagnosis present

## 2023-10-25 DIAGNOSIS — K6289 Other specified diseases of anus and rectum: Secondary | ICD-10-CM | POA: Diagnosis present

## 2023-10-25 MED ORDER — GADOBUTROL 1 MMOL/ML IV SOLN
9.0000 mL | Freq: Once | INTRAVENOUS | Status: AC | PRN
Start: 2023-10-25 — End: 2023-10-25
  Administered 2023-10-25: 9 mL via INTRAVENOUS

## 2023-10-27 ENCOUNTER — Telehealth: Payer: Self-pay | Admitting: Gastroenterology

## 2023-10-27 ENCOUNTER — Other Ambulatory Visit: Payer: Self-pay | Admitting: Gastroenterology

## 2023-10-27 ENCOUNTER — Other Ambulatory Visit: Payer: Self-pay

## 2023-10-27 DIAGNOSIS — K610 Anal abscess: Secondary | ICD-10-CM

## 2023-10-27 MED ORDER — METRONIDAZOLE 500 MG PO TABS: 500.0000 mg | ORAL_TABLET | Freq: Three times a day (TID) | ORAL | 0 refills | Status: AC

## 2023-10-27 MED ORDER — CIPROFLOXACIN HCL 500 MG PO TABS: 500.0000 mg | ORAL_TABLET | Freq: Two times a day (BID) | ORAL | 0 refills | Status: AC

## 2023-10-27 NOTE — Telephone Encounter (Signed)
 Called patient back to discuss results of imaging and plan of care including starting to antibiotics and referral to colon rectal surgeon.  Patient would also like to include his oncologist and plan of care which I told him would be a good idea to inform him of our current plan.  He will let me know if there are any reservations with what we discussed.  Patient states he is doing much better with the nitroglycerin and still taking stool softeners for constipation.

## 2023-10-27 NOTE — Progress Notes (Signed)
 Discussed case with Dr. Venice Gillis and he recommends following for imaging results-   small peri anal abscess with +/- small fistula. WBC count elevated.   Lets treat him with cipro 500 bid/flagyl 500 tid x 10 days and make appt with colorectal Sx.

## 2023-10-27 NOTE — Telephone Encounter (Signed)
 LVM to discuss imaging results. Will send message via Mychart

## 2023-10-27 NOTE — Telephone Encounter (Signed)
 Hi Deanna,   Patient returned phone and wishes for a call back to discuss with you further.   Thank you.

## 2023-11-07 ENCOUNTER — Telehealth: Payer: Self-pay

## 2023-11-07 DIAGNOSIS — K6289 Other specified diseases of anus and rectum: Secondary | ICD-10-CM

## 2023-11-07 DIAGNOSIS — K610 Anal abscess: Secondary | ICD-10-CM

## 2023-11-07 DIAGNOSIS — K602 Anal fissure, unspecified: Secondary | ICD-10-CM

## 2023-11-07 MED ORDER — METRONIDAZOLE 500 MG PO TABS: 500.0000 mg | ORAL_TABLET | Freq: Three times a day (TID) | ORAL | 0 refills | Status: DC

## 2023-11-07 MED ORDER — CIPROFLOXACIN HCL 500 MG PO TABS: 500.0000 mg | ORAL_TABLET | Freq: Two times a day (BID) | ORAL | 0 refills | Status: DC

## 2023-11-07 NOTE — Telephone Encounter (Signed)
 Called and left detailed message for patient. Prescription for abxs have been sent to pharmacy. Referral will be sent to [facility] for evaluation of peri anal abscess with suspected small fistula.

## 2023-11-07 NOTE — Telephone Encounter (Signed)
-----   Message from Devin Foerster May sent at 10/27/2023  4:26 PM EDT ----- Regarding: follow-up [name] Shelton- I tried to call patient to let him know imaging results. I also sent two antibiotics he needs to complete.  imaging results-   small peri anal abscess with suspected small fistula. WBC count elevated.   Treat him with cipro  500 bid/flagyl  500 tid x 10 days and make appt with colorectal Sx.  Can you send referral to colon rectal surgeon for evaluation of above please,  Deanna, NP

## 2023-12-04 ENCOUNTER — Ambulatory Visit: Admitting: Gastroenterology

## 2023-12-25 ENCOUNTER — Encounter: Payer: Self-pay | Admitting: Gastroenterology

## 2023-12-25 ENCOUNTER — Ambulatory Visit: Admitting: Gastroenterology

## 2023-12-25 VITALS — BP 124/84 | HR 69 | Ht 73.0 in | Wt 213.1 lb

## 2023-12-25 DIAGNOSIS — K610 Anal abscess: Secondary | ICD-10-CM

## 2023-12-25 DIAGNOSIS — C7989 Secondary malignant neoplasm of other specified sites: Secondary | ICD-10-CM

## 2023-12-25 DIAGNOSIS — Z1211 Encounter for screening for malignant neoplasm of colon: Secondary | ICD-10-CM

## 2023-12-25 DIAGNOSIS — K602 Anal fissure, unspecified: Secondary | ICD-10-CM

## 2023-12-25 MED ORDER — NA SULFATE-K SULFATE-MG SULF 17.5-3.13-1.6 GM/177ML PO SOLN: 1.0000 | Freq: Once | ORAL | 0 refills | Status: DC

## 2023-12-25 NOTE — Progress Notes (Signed)
 Agree with the assessment and plan as outlined by [facility], FNP-C.  Carlitos Bottino, DO, [facility]

## 2023-12-25 NOTE — Patient Instructions (Addendum)
 Continue stool softeners  Continue Miralax po daily  You have been scheduled for a colonoscopy. Please follow written instructions given to you at your visit today.   If you use inhalers (even only as needed), please bring them with you on the day of your procedure.  DO NOT TAKE 7 DAYS PRIOR TO TEST- Trulicity (dulaglutide) Ozempic, Wegovy (semaglutide) Mounjaro (tirzepatide) Bydureon Bcise (exanatide extended release)  DO NOT TAKE 1 DAY PRIOR TO YOUR TEST Rybelsus (semaglutide) Adlyxin (lixisenatide) Victoza (liraglutide) Byetta (exanatide) ___________________________________________________________________________    Due to recent changes in healthcare laws, you may see the results of your imaging and laboratory studies on MyChart before your provider has had a chance to review them.  We understand that in some cases there may be results that are confusing or concerning to you. Not all laboratory results come back in the same time frame and the provider may be waiting for multiple results in order to interpret others.  Please give us  48 hours in order for your provider to thoroughly review all the results before contacting the office for clarification of your results.    Thank you for trusting me with your gastrointestinal care. Deanna May, RNP

## 2023-12-25 NOTE — Progress Notes (Signed)
 Chief Complaint: follow-up Primary GI Doctor: (previously Dr. Arvie Latus) Dr. Karene Oto  HPI:  Patient is a 48 year old male patient with medical history of metastatic leiomyosarcoma first diagnosed in 2007 and hemorrhoids, who presents today for follow-up.   Pt being seen by [facility] for Metastatic Leiomyosarcoma w/ Right femur (liver, right humerus).Patient was found to have liver metastases in 2012 and underwent partial hepatectomy. Found to have right humerus lesion in September 2023, which was biopsied in Tomasita Beevers 2023 then resected and cemented in September 2023 then 5 fractions of radiation to the area. No radiation side effects at the site. Since then, MRI in July 2024 revealed progressed soft tissue enhancement. December 2024 was seen by orthopedics again who recommended likely conservative management vs humerus plating.   Interval History  Patient presents for follow-up.  Patient reports he has completed antibiotics we prescribed for small peri anal abscess with +/- small fistula seen on MRI. Patient reports since finishing antibiotics he has been feeling much better. He is taking stool softeners TID and OTC Miralax prn to keep stools soft. He also had made dietary changes which has helped. No blood in stool no rectal pain as before. He reports he did not go to colon rectal surgeon as recommended because his symptoms improved. No new issues.  Wt Readings from Last 3 Encounters:  12/25/23 213 lb 2 oz (96.7 kg)  10/23/23 208 lb (94.3 kg)  04/21/23 196 lb 12.8 oz (89.3 kg)    Past Medical History:  Diagnosis Date   Angioedema    residual right thigh/ lower leg  s/p  excision lymph node and radiation therapy 2006/ 2007   Cancer [facility])    Drug eruption 08/07/2022   Around 08/03/2021 started Scalp and abdomen Associated with razorblade tenesmus Benadryl helps    History of sarcoma oncologist-  dr Almira Armour ([facility])---  no recurrence since 2012   dx 2006  right thigh/ goin   leimyosarcoma (low grade)  s/p  excisional bx right inguinal lymph node(and resection tumor and inguinal lymph node 2007)  and radiation therapy 2007//   2012 recurrent in liver  s/p  resection tumor of liver   Right hydrocele     Past Surgical History:  Procedure Laterality Date   custom Humerus reconstruction Right 09/2023   done at [facility]   EXCISIONAL BX RIGHT INGUINAL LYMPH NODE  06/19/2005   low grade leimyosarcoma   HYDROCELE EXCISION Right 06/30/2015   Procedure: HYDROCELECTOMY ADULT;  Surgeon: Andrez Banker, MD;  Location: [facility];  Service: Urology;  Laterality: Right;   Intralesional curretage of tumor in right humerus  02/2022   RESECTION LIVER MASS  07/15/2010   recurrent sarcoma   RESECTION RIGHT INGUINAL LYMPH NODE/ TUMOR  Jan 2012    [facility]    Current Outpatient Medications  Medication Sig Dispense Refill   Na Sulfate-K Sulfate-Mg Sulfate concentrate (SUPREP) 17.5-3.13-1.6 GM/177ML SOLN Take 1 kit (354 mLs total) by mouth once for 1 dose. 354 mL 0   No current facility-administered medications for this visit.    Allergies as of 12/25/2023 - Review Complete 12/25/2023  Allergen Reaction Noted   Wound dressing adhesive Rash 02/16/2019    Family History  Adopted: Yes    Review of Systems:    Constitutional: No weight loss, fever, chills, weakness or fatigue HEENT: Eyes: No change in vision               Ears, Nose, Throat:  No change  in hearing or congestion Skin: No rash or itching Cardiovascular: No chest pain, chest pressure or palpitations   Respiratory: No SOB or cough Gastrointestinal: See HPI and otherwise negative Genitourinary: No dysuria or change in urinary frequency Neurological: No headache, dizziness or syncope Musculoskeletal: No new muscle or joint pain Hematologic: No bleeding or bruising Psychiatric: No history of depression or anxiety    Physical Exam:  Vital signs: BP 124/84   Pulse 69   Ht 6' 1 (1.854 m)    Wt 213 lb 2 oz (96.7 kg)   SpO2 95%   BMI 28.12 kg/m   Constitutional:   Pleasant male appears to be in NAD, Well developed, Well nourished, alert and cooperative Throat: Oral cavity and pharynx without inflammation, swelling or lesion.  Respiratory: Respirations even and unlabored. Lungs clear to auscultation bilaterally.   No wheezes, crackles, or rhonchi.  Cardiovascular: Normal S1, S2. Regular rate and rhythm. No peripheral edema, cyanosis or pallor.  Gastrointestinal:  Soft, nondistended, nontender. No rebound or guarding. Normal bowel sounds. No appreciable masses or hepatomegaly. Rectal: not preformed  Msk:  Symmetrical without gross deformities. Without edema, no deformity or joint abnormality.  Neurologic:  Alert and  oriented x4;  grossly normal neurologically.  Skin:   Dry and intact without significant lesions or rashes. Psychiatric: Oriented to person, place and time. Demonstrates good judgement and reason without abnormal affect or behaviors.  RELEVANT LABS AND IMAGING: 10/25/23 MRI PELVIS WITHOUT AND WITH CONTRAST  IMPRESSION: 1. Tiny focus of T2 high signal intensity in the posterior wall of the low anus (6 o'clock position), at the external sphincter. Imaging features are likely related to anal fissure although there is a subtle track of T2 hyperintensity extending along the posterior skin of the anal region along the intergluteal fold, raising the question of a very short perianal fistula although given that the finding in the anus is so superficial, this Brahim Dolman simply reflect changes related to a fissure. No evidence for perirectal abscess. 2. Bilateral groin hernias contain only fat. 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  Impression:  1. Hepatic metastatic disease is a mix of slightly improved and similar to  prior CT, though more definitively improved from November MRI.  2.  No new sites of disease identified.   Assessment: Encounter Diagnoses  Name Primary?   Special screening for malignant neoplasms, colon Yes   Perianal abscess    Anal fissure    Metastatic leiomyosarcoma to intra-abdominal site [facility])        48 year old male patient that initially presented with altered bowel habits and rectal pain.  Upon examination patient had posterior anal fissure.  Prescribed   topical diltiazem with lidocaine  at that time. Pt had called our office after using tpicals with severe rectal pain from topicals so proceeded with MRI to rule out any acute cause to explain his severe pain. Reviewed MRI with DR. Venice Gillis and  showed small peri anal abscess with +/- small fistula. WBC count elevated. Consulted with Dr Venice Gillis while Dr. Karene Oto was out of town and we decided to treat patient with cipro  500 bid/flagyl  500 tid x 10 days. Patients symptoms have improved and he will continue current regimen to keep stools soft. Will proceed with surveillance colonoscopy in [facility] with  Dr. Karene Oto and he can evaluate for any other complications/concerns at that time.  Plan: -Continue stool softeners  Continue Miralax po daily  -Schedule for a colonoscopy in [facility] with Dr. Karene Oto. The risks and benefits of colonoscopy with possible polypectomy / biopsies were discussed and the patient agrees to proceed.    Thank you for the courtesy of this consult. Please call me with any questions or concerns.   Azra Abrell, FNP-C [facility] 12/25/2023, 2:56 PM  Cc: Anthon Kins, MD

## 2024-02-02 NOTE — Telephone Encounter (Signed)
Referral sent to [facility] 

## 2024-02-03 ENCOUNTER — Other Ambulatory Visit (INDEPENDENT_AMBULATORY_CARE_PROVIDER_SITE_OTHER)

## 2024-02-03 ENCOUNTER — Telehealth: Payer: Self-pay

## 2024-02-03 ENCOUNTER — Ambulatory Visit: Payer: Self-pay | Admitting: Gastroenterology

## 2024-02-03 DIAGNOSIS — K6289 Other specified diseases of anus and rectum: Secondary | ICD-10-CM

## 2024-02-03 DIAGNOSIS — K610 Anal abscess: Secondary | ICD-10-CM | POA: Diagnosis not present

## 2024-02-03 LAB — CBC WITH DIFFERENTIAL/PLATELET
Basophils Absolute: 0 K/uL (ref 0.0–0.1)
Basophils Relative: 1 % (ref 0.0–3.0)
Eosinophils Absolute: 0.1 K/uL (ref 0.0–0.7)
Eosinophils Relative: 2.7 % (ref 0.0–5.0)
HCT: 43.4 % (ref 39.0–52.0)
Hemoglobin: 15 g/dL (ref 13.0–17.0)
Lymphocytes Relative: 23.5 % (ref 12.0–46.0)
Lymphs Abs: 1.1 K/uL (ref 0.7–4.0)
MCHC: 34.5 g/dL (ref 30.0–36.0)
MCV: 86.7 fl (ref 78.0–100.0)
Monocytes Absolute: 0.6 K/uL (ref 0.1–1.0)
Monocytes Relative: 12.2 % — ABNORMAL HIGH (ref 3.0–12.0)
Neutro Abs: 2.8 K/uL (ref 1.4–7.7)
Neutrophils Relative %: 60.6 % (ref 43.0–77.0)
Platelets: 209 K/uL (ref 150.0–400.0)
RBC: 5.01 Mil/uL (ref 4.22–5.81)
RDW: 13.4 % (ref 11.5–15.5)
WBC: 4.6 K/uL (ref 4.0–10.5)

## 2024-02-03 LAB — COMPREHENSIVE METABOLIC PANEL WITH GFR
ALT: 35 U/L (ref 0–53)
AST: 24 U/L (ref 0–37)
Albumin: 4.8 g/dL (ref 3.5–5.2)
Alkaline Phosphatase: 82 U/L (ref 39–117)
BUN: 10 mg/dL (ref 6–23)
CO2: 30 meq/L (ref 19–32)
Calcium: 9.9 mg/dL (ref 8.4–10.5)
Chloride: 99 meq/L (ref 96–112)
Creatinine, Ser: 1.03 mg/dL (ref 0.40–1.50)
GFR: 85.81 mL/min (ref >60.00)
Glucose, Bld: 97 mg/dL (ref 70–99)
Potassium: 4.5 meq/L (ref 3.5–5.1)
Sodium: 135 meq/L (ref 135–145)
Total Bilirubin: 0.8 mg/dL (ref 0.2–1.2)
Total Protein: 7.4 g/dL (ref 6.0–8.3)

## 2024-02-03 NOTE — Telephone Encounter (Signed)
-----   Message from Cathryne PARAS May sent at 02/02/2024 10:07 AM EDT ----- Regarding: Referral Pod B- Can we send referral to colon rectal surgeon for this patient please. We sent him back in April but he did not go because he felt better after antibiotics. He's messaging us  stating symptoms are back. Dx: history of small peri anal abscess with +/- small fistula.  We can check CBC, CMP today.  Deanna, NP

## 2024-02-03 NOTE — Telephone Encounter (Signed)
 Lyle DEL, CMA faxed referral to [facility] yesterday. See 7/20 patient message for details.   MyChart message sent to patient to come in for labs today. Lab orders in epic.

## 2024-02-23 ENCOUNTER — Encounter: Payer: Self-pay | Admitting: Gastroenterology

## 2024-02-26 ENCOUNTER — Telehealth: Payer: Self-pay | Admitting: Gastroenterology

## 2024-02-26 ENCOUNTER — Other Ambulatory Visit: Payer: Self-pay

## 2024-02-26 MED ORDER — NA SULFATE-K SULFATE-MG SULF 17.5-3.13-1.6 GM/177ML PO SOLN: 1.0000 | Freq: Once | ORAL | 0 refills | Status: AC

## 2024-02-26 NOTE — Telephone Encounter (Signed)
 Good Morning  Received call from patient regarding Suprep medication, He states it has not been [facility] [place] and would like it ordered over there. Please review and advise.  Thank you

## 2024-02-26 NOTE — Telephone Encounter (Addendum)
 Previous order expired, resent prep to preferred pharmacy. Pt is active on mychart, he has been made aware.

## 2024-03-02 ENCOUNTER — Ambulatory Visit (AMBULATORY_SURGERY_CENTER): Admitting: Gastroenterology

## 2024-03-02 ENCOUNTER — Encounter: Payer: Self-pay | Admitting: Gastroenterology

## 2024-03-02 VITALS — BP 138/84 | HR 52 | Temp 99.1°F | Resp 16 | Ht 73.0 in | Wt 213.0 lb

## 2024-03-02 DIAGNOSIS — D120 Benign neoplasm of cecum: Secondary | ICD-10-CM | POA: Diagnosis not present

## 2024-03-02 DIAGNOSIS — C187 Malignant neoplasm of sigmoid colon: Secondary | ICD-10-CM

## 2024-03-02 DIAGNOSIS — Z1211 Encounter for screening for malignant neoplasm of colon: Secondary | ICD-10-CM

## 2024-03-02 DIAGNOSIS — D124 Benign neoplasm of descending colon: Secondary | ICD-10-CM

## 2024-03-02 DIAGNOSIS — K5140 Inflammatory polyps of colon without complications: Secondary | ICD-10-CM | POA: Diagnosis not present

## 2024-03-02 DIAGNOSIS — K6389 Other specified diseases of intestine: Secondary | ICD-10-CM | POA: Diagnosis not present

## 2024-03-02 DIAGNOSIS — D125 Benign neoplasm of sigmoid colon: Secondary | ICD-10-CM

## 2024-03-02 DIAGNOSIS — D122 Benign neoplasm of ascending colon: Secondary | ICD-10-CM

## 2024-03-02 MED ORDER — SODIUM CHLORIDE 0.9 % IV SOLN: 500.0000 mL | INTRAVENOUS | Status: DC

## 2024-03-02 NOTE — Progress Notes (Signed)
 Called to room to assist during endoscopic procedure.  Patient ID and intended procedure confirmed with present staff. Received instructions for my participation in the procedure from the performing physician.

## 2024-03-02 NOTE — Progress Notes (Signed)
 GASTROENTEROLOGY PROCEDURE H&P NOTE   Primary Care Physician: Jesus Bernardino MATSU, MD    Reason for Procedure:  Colon Cancer screening  Plan:    Colonoscopy  Patient is appropriate for endoscopic procedure(s) in the ambulatory ([facility]) setting.  The nature of the procedure, as well as the risks, benefits, and alternatives were carefully and thoroughly reviewed with the patient. Ample time for discussion and questions allowed. The patient understood, was satisfied, and agreed to proceed.     HPI: [name] Shelton is a 48 y.o. male who presents for colonoscopy for routine Colon Cancer screening. No known family history of colon cancer or related malignancy.  Patient is otherwise without complaints or active issues today.  Previously diagnosed with anal fissure and initially treated with topical lidocaine . Subsequent MRI in 10/2023 with possible very short perianal fistula and possible small perianal abscess.  Completed course of antibiotics (Cipro /Flagyl  x 10 days) with resolution of symptoms.  Currently without any GI symptoms.   Past Medical History:  Diagnosis Date   Angioedema    residual right thigh/ lower leg  s/p  excision lymph node and radiation therapy 2006/ 2007   Cancer [facility])    Drug eruption 08/07/2022   Around 08/03/2021 started Scalp and abdomen Associated with razorblade tenesmus Benadryl helps    History of sarcoma oncologist-  dr debora ([facility])---  no recurrence since 2012   dx 2006  right thigh/ goin  leimyosarcoma (low grade)  s/p  excisional bx right inguinal lymph node(and resection tumor and inguinal lymph node 2007)  and radiation therapy 2007//   2012 recurrent in liver  s/p  resection tumor of liver   Right hydrocele     Past Surgical History:  Procedure Laterality Date   custom Humerus reconstruction Right 09/2023   done at [facility]   EXCISIONAL BX RIGHT INGUINAL LYMPH NODE  06/19/2005   low grade leimyosarcoma   HYDROCELE EXCISION Right 06/30/2015    Procedure: HYDROCELECTOMY ADULT;  Surgeon: Morene LELON Salines, MD;  Location: [facility];  Service: Urology;  Laterality: Right;   Intralesional curretage of tumor in right humerus  02/2022   RESECTION LIVER MASS  07/15/2010   recurrent sarcoma   RESECTION RIGHT INGUINAL LYMPH NODE/ TUMOR  Jan 2012    [facility]    Prior to Admission medications   Not on File    No current outpatient medications on file.   No current facility-administered medications for this visit.    Allergies as of 03/02/2024 - Review Complete 12/25/2023  Allergen Reaction Noted   Wound dressing adhesive Rash 02/16/2019    Family History  Adopted: Yes    Social History   Socioeconomic History   Marital status: Married    Spouse name: Not on file   Number of children: 0   Years of education: Not on file   Highest education level: Not on file  Occupational History   Occupation: unemployed  Tobacco Use   Smoking status: Former    Current packs/day: 0.00    Average packs/day: 0.5 packs/day for 10.0 years (5.0 ttl pk-yrs)    Types: Cigarettes    Start date: 07/15/1994    Quit date: 07/15/2004    Years since quitting: 19.6   Smokeless tobacco: Never  Vaping Use   Vaping status: Never Used  Substance and Sexual Activity   Alcohol use: Not Currently    Alcohol/week: 6.0 - 12.0 standard drinks of alcohol    Types: 6 - 12 Cans of beer  per week    Comment: not currently using any alcoholic beverages   Drug use: Not Currently    Types: Marijuana   Sexual activity: Yes  Other Topics Concern   Not on file  Social History Narrative   Not on file   Social Drivers of Health   Financial Resource Strain: Low Risk  (09/17/2023)   Received from [facility]   Overall Financial Resource Strain (CARDIA)    Difficulty of Paying Living Expenses: Not hard at all  Food Insecurity: No Food Insecurity (09/17/2023)   Received from [facility]   Hunger Vital Sign     Within the past 12 months, you worried that your food would run out before you got the money to buy more.: Never true    Within the past 12 months, the food you bought just didn't last and you didn't have money to get more.: Never true  Transportation Needs: Unknown (09/17/2023)   Received from [facility] - Transportation    In the past 12 months, has lack of transportation kept you from medical appointments or from getting medications?: No    Lack of Transportation (Non-Medical): Not on file  Physical Activity: Not on file  Stress: Not on file  Social Connections: Not on file  Intimate Partner Violence: Not on file    Physical Exam: Vital signs in last 24 hours: @There  were no vitals taken for this visit. GEN: NAD EYE: Sclerae anicteric ENT: MMM CV: Non-tachycardic Pulm: CTA b/l GI: Soft, NT/ND NEURO:  Alert & Oriented x 3   Sandor Flatter, DO [facility]   03/02/2024 8:02 AM

## 2024-03-02 NOTE — Progress Notes (Signed)
 0945 Patient experiencing nausea and retching.  MD updated and Zofran  4 mg IV given, vss

## 2024-03-02 NOTE — Progress Notes (Signed)
 Report given to PACU, vss

## 2024-03-02 NOTE — Op Note (Signed)
 [facility] Patient Name: [name] Shelton Procedure Date: 03/02/2024 9:08 AM MRN: [id number] Endoscopist: Sandor Flatter , MD, [id number] Age: 48 Referring MD:  Date of Birth: 1975-10-07 Gender: Male Account #: [id number] Procedure:                Colonoscopy Indications:              Screening for colorectal malignant neoplasm, This                            is the patient's first colonoscopy Medicines:                Monitored Anesthesia Care Procedure:                Pre-Anesthesia Assessment:                           - Prior to the procedure, a History and Physical                            was performed, and patient medications and                            allergies were reviewed. The patient's tolerance of                            previous anesthesia was also reviewed. The risks                            and benefits of the procedure and the sedation                            options and risks were discussed with the patient.                            All questions were answered, and informed consent                            was obtained. Prior Anticoagulants: The patient has                            taken no anticoagulant or antiplatelet agents. ASA                            Grade Assessment: II - A patient with mild systemic                            disease. After reviewing the risks and benefits,                            the patient was deemed in satisfactory condition to                            undergo the procedure.  After obtaining informed consent, the colonoscope                            was passed under direct vision. Throughout the                            procedure, the patient's blood pressure, pulse, and                            oxygen saturations were monitored continuously. The                            Olympus Scope SN [id number] was introduced through the                            anus and advanced  to the the terminal ileum. The                            colonoscopy was performed without difficulty. The                            patient tolerated the procedure well. The quality                            of the bowel preparation was good. The terminal                            ileum, ileocecal valve, appendiceal orifice, and                            rectum were photographed. Scope In: 9:16:31 AM Scope Out: 10:04:06 AM Scope Withdrawal Time: 0 hours 42 minutes 51 seconds  Total Procedure Duration: 0 hours 47 minutes 35 seconds  Findings:                 The perianal and digital rectal examinations were                            normal.                           A 15 mm polyp was found in the cecum. The polyp was                            sessile. The polyp was removed with a cold snare.                            Resection and retrieval were complete. Estimated                            blood loss was minimal.                           Five sessile polyps were found in the sigmoid colon                            (  3), descending colon (1), and ascending colon (1).                            The polyps were 2 to 5 mm in size. These polyps                            were removed with a cold snare. Resection and                            retrieval were complete. Estimated blood loss was                            minimal.                           A 20 mm polyp was found in the sigmoid colon,                            located 25 cm from the anal verge. The polyp was                            pedunculated. The polyp was removed with a hot                            snare. Resection and retrieval were complete.                            Estimated blood loss: none.                           An 18 mm polyp was found in the sigmoid colon,                            located 20 cm from anal verge. The polyp was                            sessile. The polyp was removed with a hot  snare.                            Resection and retrieval were complete. Area 2 cm                            distal to the polypectomy site and on the                            contralateral wall was tattooed with an injection                            of 3 mL of Spot (carbon black).                           The retroflexed view of the distal rectum and anal  verge was normal and showed no anal or rectal                            abnormalities. No anal fissure was noted on digital                            rectal exam or endoscopy, both with anterograde and                            retroflexed views.                           The terminal ileum appeared normal. Complications:            No immediate complications. Estimated Blood Loss:     Estimated blood loss was minimal. Impression:               - One 15 mm polyp in the cecum, removed with a cold                            snare. Resected and retrieved.                           - Five 2 to 5 mm polyps in the sigmoid colon, in                            the descending colon and in the ascending colon,                            removed with a cold snare. Resected and retrieved.                           - One 20 mm polyp in the sigmoid colon, removed                            with a hot snare. Resected and retrieved.                           - One 18 mm polyp in the sigmoid colon, removed                            with a hot snare. Resected and retrieved. Tattooed.                           - The distal rectum and anal verge are normal on                            retroflexion view.                           - The examined portion of the ileum was normal. Recommendation:           - Patient has a contact number available for  emergencies. The signs and symptoms of potential                            delayed complications were discussed with the                             patient. Return to normal activities tomorrow.                            Written discharge instructions were provided to the                            patient.                           - Resume previous diet.                           - Continue present medications.                           - Await pathology results.                           - Repeat colonoscopy for surveillance based on                            pathology results.                           - Return to GI office PRN. Sandor Flatter, MD 03/02/2024 10:15:07 AM

## 2024-03-02 NOTE — Patient Instructions (Signed)
 Thank you for letting us  care for your healthcare needs today! Please see handout regarding Polyps. Resume previous diet and current medications. Await pathology results.   YOU HAD AN ENDOSCOPIC PROCEDURE TODAY AT THE [facility]:   Refer to the procedure report that was given to you for any specific questions about what was found during the examination.  If the procedure report does not answer your questions, please call your gastroenterologist to clarify.  If you requested that your care partner not be given the details of your procedure findings, then the procedure report has been included in a sealed envelope for you to review at your convenience later.  YOU SHOULD EXPECT: Some feelings of bloating in the abdomen. Passage of more gas than usual.  Walking can help get rid of the air that was put into your GI tract during the procedure and reduce the bloating. If you had a lower endoscopy (such as a colonoscopy or flexible sigmoidoscopy) you may notice spotting of blood in your stool or on the toilet paper. If you underwent a bowel prep for your procedure, you may not have a normal bowel movement for a few days.  Please Note:  You might notice some irritation and congestion in your nose or some drainage.  This is from the oxygen used during your procedure.  There is no need for concern and it should clear up in a day or so.  SYMPTOMS TO REPORT IMMEDIATELY:  Following lower endoscopy (colonoscopy or flexible sigmoidoscopy):  Excessive amounts of blood in the stool  Significant tenderness or worsening of abdominal pains  Swelling of the abdomen that is new, acute  Fever of 100F or higher  For urgent or emergent issues, a gastroenterologist can be reached at any hour by calling [phone]. Do not use MyChart messaging for urgent concerns.    DIET:  We do recommend a small meal at first, but then you may proceed to your regular diet.  Drink plenty of fluids but you should avoid  alcoholic beverages for 24 hours.  ACTIVITY:  You should plan to take it easy for the rest of today and you should NOT DRIVE or use heavy machinery until tomorrow (because of the sedation medicines used during the test).    FOLLOW UP: Our staff will call the number listed on your records the next business day following your procedure.  We will call around 7:15- 8:00 am to check on you and address any questions or concerns that you may have regarding the information given to you following your procedure. If we do not reach you, we will leave a message.     If any biopsies were taken you will be contacted by phone or by letter within the next 1-3 weeks.  Please call us  at [phone] if you have not heard about the biopsies in 3 weeks.    SIGNATURES/CONFIDENTIALITY: You and/or your care partner have signed paperwork which will be entered into your electronic medical record.  These signatures attest to the fact that that the information above on your After Visit Summary has been reviewed and is understood.  Full responsibility of the confidentiality of this discharge information lies with you and/or your care-partner.

## 2024-03-03 ENCOUNTER — Telehealth: Payer: Self-pay | Admitting: *Deleted

## 2024-03-03 NOTE — Telephone Encounter (Signed)
  Follow up Call-     03/02/2024    8:25 AM  Call back number  Post procedure Call Back phone  # [phone]  Permission to leave phone message Yes     Patient questions:  Do you have a fever, pain , or abdominal swelling? No. Pain Score  0 *  Have you tolerated food without any problems? Yes.    Have you been able to return to your normal activities? Yes.    Do you have any questions about your discharge instructions: Diet   No. Medications  No. Follow up visit  No.  Do you have questions or concerns about your Care? No.  Actions: * If pain score is 4 or above: No action needed, pain <4.

## 2024-03-05 LAB — SURGICAL PATHOLOGY

## 2024-03-09 ENCOUNTER — Ambulatory Visit: Payer: Self-pay | Admitting: Gastroenterology
